# Patient Record
Sex: Female | Born: 1970 | Race: Black or African American | Hispanic: No | Marital: Married | Smoking: Never smoker
Health system: Southern US, Community
[De-identification: ages and names within clinical notes are randomized; demographics above are authoritative.]

## PROBLEM LIST (undated history)

## (undated) DIAGNOSIS — Z789 Other specified health status: Secondary | ICD-10-CM

## (undated) DIAGNOSIS — G43909 Migraine, unspecified, not intractable, without status migrainosus: Secondary | ICD-10-CM

## (undated) DIAGNOSIS — Z973 Presence of spectacles and contact lenses: Secondary | ICD-10-CM

## (undated) DIAGNOSIS — R7303 Prediabetes: Secondary | ICD-10-CM

## (undated) DIAGNOSIS — B9689 Other specified bacterial agents as the cause of diseases classified elsewhere: Secondary | ICD-10-CM

## (undated) DIAGNOSIS — A609 Anogenital herpesviral infection, unspecified: Secondary | ICD-10-CM

## (undated) DIAGNOSIS — N760 Acute vaginitis: Secondary | ICD-10-CM

## (undated) HISTORY — DX: Migraine, unspecified, not intractable, without status migrainosus: G43.909

## (undated) HISTORY — DX: Anogenital herpesviral infection, unspecified: A60.9

## (undated) HISTORY — DX: Other specified bacterial agents as the cause of diseases classified elsewhere: B96.89

## (undated) HISTORY — DX: Other specified bacterial agents as the cause of diseases classified elsewhere: N76.0

---

## 1998-08-19 ENCOUNTER — Other Ambulatory Visit: Admission: RE | Admit: 1998-08-19 | Discharge: 1998-08-19 | Payer: Self-pay | Admitting: Obstetrics

## 1998-10-21 ENCOUNTER — Other Ambulatory Visit: Admission: RE | Admit: 1998-10-21 | Discharge: 1998-10-21 | Payer: Self-pay | Admitting: Obstetrics

## 1999-02-19 ENCOUNTER — Emergency Department (HOSPITAL_COMMUNITY): Admission: EM | Admit: 1999-02-19 | Discharge: 1999-02-19 | Payer: Self-pay | Admitting: Emergency Medicine

## 1999-05-17 ENCOUNTER — Inpatient Hospital Stay (HOSPITAL_COMMUNITY): Admission: AD | Admit: 1999-05-17 | Discharge: 1999-05-17 | Payer: Self-pay | Admitting: Obstetrics

## 1999-05-25 ENCOUNTER — Encounter: Payer: Self-pay | Admitting: Obstetrics

## 1999-05-25 ENCOUNTER — Ambulatory Visit (HOSPITAL_COMMUNITY): Admission: AD | Admit: 1999-05-25 | Discharge: 1999-05-25 | Payer: Self-pay | Admitting: Obstetrics

## 2000-04-21 ENCOUNTER — Other Ambulatory Visit: Admission: RE | Admit: 2000-04-21 | Discharge: 2000-04-21 | Payer: Self-pay | Admitting: *Deleted

## 2000-06-19 ENCOUNTER — Inpatient Hospital Stay (HOSPITAL_COMMUNITY): Admission: AD | Admit: 2000-06-19 | Discharge: 2000-06-19 | Payer: Self-pay | Admitting: Gynecology

## 2000-12-26 ENCOUNTER — Ambulatory Visit (HOSPITAL_COMMUNITY): Admission: RE | Admit: 2000-12-26 | Discharge: 2000-12-26 | Payer: Self-pay | Admitting: *Deleted

## 2000-12-26 ENCOUNTER — Encounter (INDEPENDENT_AMBULATORY_CARE_PROVIDER_SITE_OTHER): Payer: Self-pay

## 2000-12-26 ENCOUNTER — Inpatient Hospital Stay (HOSPITAL_COMMUNITY): Admission: AD | Admit: 2000-12-26 | Discharge: 2000-12-29 | Payer: Self-pay | Admitting: Gynecology

## 2000-12-26 ENCOUNTER — Encounter: Payer: Self-pay | Admitting: *Deleted

## 2001-02-02 ENCOUNTER — Other Ambulatory Visit: Admission: RE | Admit: 2001-02-02 | Discharge: 2001-02-02 | Payer: Self-pay | Admitting: *Deleted

## 2002-10-03 ENCOUNTER — Other Ambulatory Visit: Admission: RE | Admit: 2002-10-03 | Discharge: 2002-10-03 | Payer: Self-pay | Admitting: Gynecology

## 2003-04-01 ENCOUNTER — Emergency Department (HOSPITAL_COMMUNITY): Admission: EM | Admit: 2003-04-01 | Discharge: 2003-04-02 | Payer: Self-pay | Admitting: *Deleted

## 2003-04-07 ENCOUNTER — Encounter: Admission: RE | Admit: 2003-04-07 | Discharge: 2003-04-07 | Payer: Self-pay | Admitting: Chiropractic Medicine

## 2003-04-07 ENCOUNTER — Encounter: Payer: Self-pay | Admitting: Chiropractic Medicine

## 2005-10-17 ENCOUNTER — Other Ambulatory Visit: Admission: RE | Admit: 2005-10-17 | Discharge: 2005-10-17 | Payer: Self-pay | Admitting: Gynecology

## 2006-08-13 ENCOUNTER — Emergency Department (HOSPITAL_COMMUNITY): Admission: EM | Admit: 2006-08-13 | Discharge: 2006-08-13 | Payer: Self-pay | Admitting: Emergency Medicine

## 2006-11-09 ENCOUNTER — Other Ambulatory Visit: Admission: RE | Admit: 2006-11-09 | Discharge: 2006-11-09 | Payer: Self-pay | Admitting: Gynecology

## 2007-07-03 ENCOUNTER — Inpatient Hospital Stay (HOSPITAL_COMMUNITY): Admission: AD | Admit: 2007-07-03 | Discharge: 2007-07-06 | Payer: Self-pay | Admitting: Obstetrics

## 2007-07-04 ENCOUNTER — Encounter (INDEPENDENT_AMBULATORY_CARE_PROVIDER_SITE_OTHER): Payer: Self-pay | Admitting: Obstetrics

## 2007-07-04 HISTORY — PX: TUBAL LIGATION: SHX77

## 2008-05-02 ENCOUNTER — Emergency Department (HOSPITAL_COMMUNITY): Admission: EM | Admit: 2008-05-02 | Discharge: 2008-05-02 | Payer: Self-pay | Admitting: Emergency Medicine

## 2009-04-09 ENCOUNTER — Other Ambulatory Visit: Admission: RE | Admit: 2009-04-09 | Discharge: 2009-04-09 | Payer: Self-pay | Admitting: Gynecology

## 2009-04-09 ENCOUNTER — Ambulatory Visit: Payer: Self-pay | Admitting: Women's Health

## 2009-04-09 ENCOUNTER — Encounter: Payer: Self-pay | Admitting: Women's Health

## 2009-09-23 ENCOUNTER — Ambulatory Visit: Payer: Self-pay | Admitting: Gynecology

## 2009-09-23 HISTORY — PX: INCISE AND DRAIN ABCESS: PRO64

## 2009-11-03 ENCOUNTER — Ambulatory Visit: Payer: Self-pay | Admitting: Gynecology

## 2009-11-04 ENCOUNTER — Ambulatory Visit: Payer: Self-pay | Admitting: Gynecology

## 2009-11-17 ENCOUNTER — Ambulatory Visit: Payer: Self-pay | Admitting: Gynecology

## 2010-01-14 ENCOUNTER — Ambulatory Visit: Payer: Self-pay | Admitting: Women's Health

## 2010-03-18 ENCOUNTER — Ambulatory Visit: Payer: Self-pay | Admitting: Gynecology

## 2010-05-20 ENCOUNTER — Ambulatory Visit: Payer: Self-pay | Admitting: Women's Health

## 2010-05-20 ENCOUNTER — Other Ambulatory Visit: Admission: RE | Admit: 2010-05-20 | Discharge: 2010-05-20 | Payer: Self-pay | Admitting: Gynecology

## 2010-05-24 ENCOUNTER — Emergency Department (HOSPITAL_COMMUNITY): Admission: EM | Admit: 2010-05-24 | Discharge: 2010-05-25 | Payer: Self-pay | Admitting: Emergency Medicine

## 2010-05-26 ENCOUNTER — Ambulatory Visit: Payer: Self-pay | Admitting: Gynecology

## 2010-07-28 ENCOUNTER — Ambulatory Visit: Admit: 2010-07-28 | Payer: Self-pay | Admitting: Women's Health

## 2010-09-14 LAB — CBC
Hemoglobin: 13.3 g/dL (ref 12.0–15.0)
MCH: 30.2 pg (ref 26.0–34.0)
MCHC: 34.4 g/dL (ref 30.0–36.0)
MCV: 88 fL (ref 78.0–100.0)

## 2010-09-14 LAB — BASIC METABOLIC PANEL
CO2: 22 mEq/L (ref 19–32)
Calcium: 9.5 mg/dL (ref 8.4–10.5)
Chloride: 103 mEq/L (ref 96–112)
Creatinine, Ser: 0.76 mg/dL (ref 0.4–1.2)
Glucose, Bld: 145 mg/dL — ABNORMAL HIGH (ref 70–99)
Sodium: 137 mEq/L (ref 135–145)

## 2010-09-14 LAB — DIFFERENTIAL
Eosinophils Absolute: 0 10*3/uL (ref 0.0–0.7)
Eosinophils Relative: 0 % (ref 0–5)
Lymphs Abs: 2.1 10*3/uL (ref 0.7–4.0)
Monocytes Absolute: 0.9 10*3/uL (ref 0.1–1.0)

## 2010-09-14 LAB — URINALYSIS, ROUTINE W REFLEX MICROSCOPIC
Glucose, UA: NEGATIVE mg/dL
Hgb urine dipstick: NEGATIVE
Ketones, ur: 15 mg/dL — AB
Nitrite: NEGATIVE
Protein, ur: NEGATIVE mg/dL
Specific Gravity, Urine: 1.025 (ref 1.005–1.030)
Urobilinogen, UA: 1 mg/dL (ref 0.0–1.0)
pH: 8.5 — ABNORMAL HIGH (ref 5.0–8.0)

## 2010-09-14 LAB — URINE MICROSCOPIC-ADD ON

## 2010-09-14 LAB — CK: Total CK: 73 U/L (ref 7–177)

## 2010-09-14 LAB — POCT PREGNANCY, URINE: Preg Test, Ur: NEGATIVE

## 2010-11-16 NOTE — Op Note (Signed)
NAMEMIKISHA, ROSELAND NO.:  [id number]   MEDICAL RECORD NO.:  [id number]          PATIENT TYPE:  INP   LOCATION:  9134                          FACILITY:  [facility]   PHYSICIAN:  Kathreen Cosier, M.D.DATE OF BIRTH:  Jan 30, 1971   DATE OF PROCEDURE:  07/04/2007  DATE OF DISCHARGE:                               OPERATIVE REPORT   PREOPERATIVE DIAGNOSIS:  Multiparity.   POSTOPERATIVE DIAGNOSIS:  Multiparity.   PROCEDURE:  Postpartum tubal ligation.   PROCEDURE IN DETAIL:  Using epidural, the patient in the supine  position, abdomen was prepped and draped.  Bladder was emptied with a  Foley catheter.  A midline subumbilical incision 1 inch long was made  and carried down to the fascia.  Fascia grasped with two Kocher's,  cleaned, and then the fascia and peritoneum opened with the Mayo  scissors.  Left tube grasped in midportion with Babcock clamp.  Tube  traced to the fimbria and 0 plain suture placed in the mesosalpinx below  the portion of tube within clamp.  This was tied and approximately 1  inch of tube transected.  The procedure done in a similar fashion the  other side.  Lap and sponge counts correct.  Abdomen closed in layers,  peritoneum and fascia continuous suture with 0 Dexon, skin closed with  subcuticular stitch of 4-0 Monocryl.  Blood loss minimal.  The patient  tolerated the procedure well, taken to recovery room in good condition.           ______________________________  Kathreen Cosier, M.D.     BAM/MEDQ  D:  07/04/2007  T:  07/04/2007  Job:  [id number]

## 2010-11-19 NOTE — Op Note (Signed)
[facility]  Patient:    Danielle [name], Danielle [name]                     MRN: [id number] Proc. Date: 12/26/00 Adm. Date:  [id number] Attending:  Merrily Pew                           Operative Report  PREOPERATIVE DIAGNOSES:       1. Term pregnancy, breech presentation.                               2. Oligohydramnios.                               3. Spontaneous rupture of membranes.  POSTOPERATIVE DIAGNOSES:      1. Term pregnancy, breech presentation.                               2. Oligohydramnios.                               3. Spontaneous rupture of membranes.  OPERATION:                    Primary low transverse cervical cesarean                               section.  SURGEON:                      Timothy P. Fontaine, M.D.  ASSISTANT:                    Scrub technician.  ANESTHESIA:                   Spinal.  ESTIMATED BLOOD LOSS:         Less than 500 cc.  COMPLICATIONS:                None.  FINDINGS:                     At 2113 normal female infant.  Apgars 8 and 9. Weight 8 pounds and 10 ounces.  Pelvic anatomy noted to be normal.  DESCRIPTION OF PROCEDURE:     The patient was taken to the operating room and underwent spinal anesthesia, was placed in the left _______ supine position, and received an abdominal preparation with Betadine scrub and Betadine solution.  Bladder emptied with an indwelling Foley catheter placed under sterile technique, and the patient was draped in the usual fashion.  After assuring adequate anesthesia, the abdomen was sharply entered through a Pfannenstiel incision achieving adequate hemostasis at all levels.  The bladder flap was then sharply and bluntly developed without difficulty and the uterus was sharply entered in the lower uterine segment and bluntly extended laterally.  The bulging membranes were ruptured and the fluid noted to be clear.  The infant was found in the frank breech presentation  and underwent a conversion to a footling breech and a classical footling breech extraction. There was an umbilical cord wrapped around the infants thigh.  The nares and  mouth were suctioned upon delivery, the cord doubly clamped and cut, and the infant was handed to pediatrics in attendance.  Samples of cord blood were then obtained.  The placenta was then initially spontaneously extracted. During the extraction process, the umbilical cord avulsed, and subsequently a manual extraction was performed.  The uterus was then exteriorized and the endometrial cavity explored with the sponge to remove all placental and membrane fragments.  The patient received 1 g of Cefotan IV prophylaxis after the infants delivery.  The uterine incision was then closed in one layer using 0 Vicryl suture in a running interlocking stitch.  The uterus was returned to the abdomen which was copiously irrigated.  Adequate hemostasis was visualized and subsequently the anterior fascia was reapproximated using 0 Vicryl suture in a running stitch. Subcutaneous tissues were irrigated, adequate hemostasis achieved with electrocautery, and the skin was reapproximated with staples.  A sterile dressing was applied and the patient was taken to the recovery room in good condition having tolerated the procedure well. DD:  12/26/00 TD:  12/27/00 Job: [id number]

## 2010-11-19 NOTE — Discharge Summary (Signed)
[facility]  Patient:    Danielle [name], MADDING                     MRN: [id number] Adm. Date:  [id number] Disc. Date: [id number] Attending:  Merrily Pew Dictator:   Antony Contras, N.P.                           Discharge Summary  DISCHARGE DIAGNOSES:          Intrauterine pregnancy at term, breech presentation, oligohydramnios, spontaneous rupture of membranes.  PROCEDURE:                    Primary low cervical transverse cesarean section with delivery of viable infant.  HISTORY OF PRESENT ILLNESS:   Patient is a 40 year old gravida 6, para 1-0-4-1 with an LMP of March 16, 2000, [facility] of December 21, 2000.  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  PRENATAL LABORATORIES:        Blood type AB+.  Antibody screen negative. Sickle cell negative.  RPR, HBSAG, HIV nonreactive.  Rubella non-immune. MSAFP normal.  GBS negative.  HOSPITAL COURSE:              Patient was diagnosed with a breech presentation on December 24, 2000 and during that ultrasound was found to have an AFI of 6.8 cm.  She presents on December 26, 2000 with spontaneous rupture of membranes.  She was taken to the OR where primary low transverse cesarean section was performed by Dr. Audie Box under spinal anesthesia.  She delivered an Apgar 8/9 female infant weighing 8 pounds 10 ounces.  Pelvic anatomy was normal. Postoperative course:  She remained afebrile.  She had no difficulty voiding and was able to be discharged on her third postoperative day in satisfactory condition.  LABORATORIES:                 CBC:  Hematocrit 30.1, hemoglobin 10.4, WBC 11.9, platelets 171,000.  DISPOSITION:                  Follow-up in six weeks.  Continue prenatal vitamins and iron, Motrin and Tylox for pain.  Rubella vaccine prior to discharge. DD:  01/19/01 TD:  01/20/01 Job: [id number]

## 2010-11-19 NOTE — H&P (Signed)
[facility]  Patient:    Danielle [name], Danielle [name]                     MRN: [id number] Attending:  Katy Fitch, M.D.                         History and Physical  CHIEF COMPLAINT:              Breech presentation at term.  HISTORY OF PRESENT ILLNESS:   The patient is a 40 year old G 5, P 1, AB 3, at 40 weeks and 5 days, who presented to the office on December 24, 2000, and was noted to feel in breech presentation.  The patient was sent to the hospital for an ultrasound, which showed the patient to be in a complete breech presentation with 6.8 cm of fluid.  The patient returned for an appointment on June 25th, and the fetus was still in the breech presentation, and options for delivery were discussed with the patient, including attempting a vaginal breech, attempting version, and proceeding with a primary low transverse cesarean section.  After discussing the risks and benefits of each procedure, the patient decided to opt for a primary low transverse cesarean section.  The patient has had no other prenatal complications.  PAST MEDICAL HISTORY:         Negative.  PAST SURGICAL HISTORY:        A D&C x 4.  PAST GYN HISTORY:             Cryo surgery for an abnormal Pap smear.  PAST OB HISTORY:              Three elective abortions, one vaginal delivery weighing 5 pounds 6 ounces in 1994.  CURRENT MEDICATIONS:          Prenatal vitamins.  ALLERGIES:                    No known drug allergies.  SOCIAL HISTORY:               Without any alcohol, tobacco, or drugs.  FAMILY HISTORY:               Without mental retardation or epithelial cancers.  PRENATAL LABORATORY DATA:     AB-positive, rubella nonimmune, GBS negative.  PHYSICAL EXAMINATION:  VITAL SIGNS:                  Blood pressure 122/82.  HEENT:                        Clear.  LUNGS:                        Clear to auscultation bilaterally.  HEART:                        A regular rate and  rhythm.  ABDOMEN:                      Gravid, nontender.  Breech presentation.  EXTREMITIES:                  Without cyanosis, clubbing, edema, or tenderness.  PLAN:                         The patient  is scheduled for a primary low transverse cesarean section on December 27, 2000, at 9 a.m.  The risks and benefits of the procedure, including bleeding, transfusion, infection, scar tissue, wound breakdown, damage to internal organs, including the bowel, bladder, ureters, and reproductive organs, fistula formation, hysterectomy, and anesthetic complications of blood clots, stroke, pulmonary embolus, and possibly death.  The patient states that she understands this and desires to proceed.  The patient will have a CBC prior to surgery, and be n.p.o. after midnight. DD:  12/26/00 TD:  12/26/00 Job: [id number]

## 2010-11-19 NOTE — H&P (Signed)
[facility]  Patient:    Danielle [name], Danielle [name]                     MRN: [id number] Adm. Date:  [id number] Attending:  Merrily Pew                         History and Physical  CHIEF COMPLAINT:              Pregnancy at term with probable rupture of membranes.  Breech presentation and oligohydramnios.  HISTORY OF PRESENT ILLNESS:   The patient is a 40 year old G6, P89 female at term gestation.  She recently underwent ultrasound today due to size greater than dates for estimated fetal weight and was found to be in a breech presentation with an AFI of 6.  The patient had been counseled during the day for options and is scheduled for a cesarean section tomorrow morning.  The patient relates this evening a gush of wetness per vagina.  She has continued to leak since then.  She also had some mild cramping.  Her prenatal course has been uncomplicated to date.  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  PAST MEDICAL HISTORY:         Unremarkable.  PAST SURGICAL HISTORY:        D&C x 4.  ALLERGIES:                    None.  MEDICATIONS:                  Vitamins.  REVIEW OF SYSTEMS:            Noncontributory.  PHYSICAL EXAMINATION:  VITAL SIGNS:                  Afebrile.  Blood pressure 140/86 with stable vital signs.  HEENT:                        Normal.  LUNGS:                        Clear.  CARDIAC:                      Regular rate.  No rubs, murmurs or gallops.  ABDOMEN:                      Gravid uterus.  Reactive fetus.  Mild uterine irritability.  PELVIC:                       The cervix is finger tip dilated.  Breech presentation with vaginal moisture noted.  ASSESSMENT:                   As 40 year old G6, P5, AB4 female with term gestation, breech presentation, and probable rupture of membranes for planned cesarean section.  I reviewed the situation with the  patient and will proceed with cesarean section this evening.  I reviewed with her what was involved with cesarean section to include the risks of infection, prolonged antibiotics, wound complications, opening and draining of incisions closure by secondary intention, the risks of hemorrhage necessitating transfusion and the risk of transfusion were reviewed.  The risks of inadvertent injury to internal organs including  bowel, bladder, ureters, vessels and nerves necessitating major exploratory repair with surgeries in the future and repairative surgeries including ostomy formation.  The risk of fetal injury including musculoskeletal, neural and scalp injuries were all reviewed and were accepted.  The patients questions were answered to her satisfactory. She is ready to proceed with surgery. DD:  12/26/00 TD:  12/27/00 Job: [id number]

## 2011-03-23 LAB — CBC
HCT: 25 — ABNORMAL LOW
MCHC: 34.3
MCV: 91.2
Platelets: 169
RDW: 14.3
WBC: 15.6 — ABNORMAL HIGH

## 2011-04-08 LAB — COMPREHENSIVE METABOLIC PANEL
ALT: 11
AST: 22
Alkaline Phosphatase: 114
CO2: 24
Calcium: 8.3 — ABNORMAL LOW
GFR calc Af Amer: >60
GFR calc non Af Amer: >60
Glucose, Bld: 111 — ABNORMAL HIGH
Potassium: 3.7
Sodium: 136

## 2011-04-08 LAB — CBC
Hemoglobin: 10 — ABNORMAL LOW
Platelets: 189
RBC: 3.25 — ABNORMAL LOW
RDW: 14
WBC: 13.4 — ABNORMAL HIGH
WBC: 26.9 — ABNORMAL HIGH

## 2011-04-08 LAB — RPR: RPR Ser Ql: NONREACTIVE

## 2011-04-08 LAB — LACTATE DEHYDROGENASE: LDH: 153

## 2011-07-07 ENCOUNTER — Ambulatory Visit (INDEPENDENT_AMBULATORY_CARE_PROVIDER_SITE_OTHER): Payer: BC Managed Care – PPO | Admitting: Women's Health

## 2011-07-07 ENCOUNTER — Other Ambulatory Visit: Payer: Self-pay | Admitting: Women's Health

## 2011-07-07 ENCOUNTER — Encounter: Payer: Self-pay | Admitting: Women's Health

## 2011-07-07 VITALS — BP 122/80 | Ht 65.25 in | Wt 163.0 lb

## 2011-07-07 DIAGNOSIS — N898 Other specified noninflammatory disorders of vagina: Secondary | ICD-10-CM

## 2011-07-07 DIAGNOSIS — Z13220 Encounter for screening for lipoid disorders: Secondary | ICD-10-CM

## 2011-07-07 DIAGNOSIS — Z833 Family history of diabetes mellitus: Secondary | ICD-10-CM

## 2011-07-07 DIAGNOSIS — B009 Herpesviral infection, unspecified: Secondary | ICD-10-CM

## 2011-07-07 DIAGNOSIS — R350 Frequency of micturition: Secondary | ICD-10-CM

## 2011-07-07 DIAGNOSIS — Z01419 Encounter for gynecological examination (general) (routine) without abnormal findings: Secondary | ICD-10-CM

## 2011-07-07 LAB — LIPID PANEL
Cholesterol: 215 mg/dL — ABNORMAL HIGH (ref 0–200)
Triglycerides: 182 mg/dL — ABNORMAL HIGH (ref <150)
VLDL: 36 mg/dL (ref 0–40)

## 2011-07-07 LAB — CBC WITH DIFFERENTIAL/PLATELET
Basophils Relative: 0 % (ref 0–1)
Eosinophils Absolute: 0.2 10*3/uL (ref 0.0–0.7)
Eosinophils Relative: 2 % (ref 0–5)
Hemoglobin: 12.3 g/dL (ref 12.0–15.0)
Lymphs Abs: 2.9 10*3/uL (ref 0.7–4.0)
MCH: 28.5 pg (ref 26.0–34.0)
MCHC: 31.5 g/dL (ref 30.0–36.0)
MCV: 90.5 fL (ref 78.0–100.0)
Monocytes Absolute: 0.6 10*3/uL (ref 0.1–1.0)
Monocytes Relative: 6 % (ref 3–12)
RBC: 4.32 MIL/uL (ref 3.87–5.11)

## 2011-07-07 LAB — WET PREP, GENITAL: Clue Cells Wet Prep HPF POC: NONE SEEN

## 2011-07-07 LAB — URINALYSIS, ROUTINE W REFLEX MICROSCOPIC
Bilirubin Urine: NEGATIVE
Glucose, UA: NEGATIVE mg/dL
Ketones, ur: NEGATIVE mg/dL
Protein, ur: NEGATIVE mg/dL
Specific Gravity, Urine: 1.025 (ref 1.005–1.030)
Urobilinogen, UA: 0.2 mg/dL (ref 0.0–1.0)

## 2011-07-07 LAB — URINALYSIS, MICROSCOPIC ONLY
Casts: NONE SEEN
Crystals: NONE SEEN

## 2011-07-07 MED ORDER — VALACYCLOVIR HCL 500 MG PO TABS: 500.0000 mg | ORAL_TABLET | Freq: Two times a day (BID) | ORAL | Status: AC

## 2011-07-07 NOTE — Progress Notes (Signed)
Danielle [name] 1970/09/18 [id number]    History:    The patient presents for annual exam.  Monthly 4-5 day cycle/BTL/same partner. Complaint of increased urinary frequency, urgency and abdominal pressure when bladder is full. States has occasional pain that radiates from her abdomen through to her buttocks. Denies any constipation or changes in elimination. History of normal Paps.   Past medical history, past surgical history, family history and social history were all reviewed and documented in the EPIC chart. Laid off from [employer], 3 children ages 21, 80, and 4 all doing well an 39 year old has had Gardasil.   ROS:  A  ROS was performed and pertinent positives and negatives are included in the history.  Exam:  Filed Vitals:   07/07/11 1434  BP: 122/80    General appearance:  Normal Head/Neck:  Normal, without cervical or supraclavicular adenopathy. Thyroid:  Symmetrical, normal in size, without palpable masses or nodularity. Respiratory  Effort:  Normal  Auscultation:  Clear without wheezing or rhonchi Cardiovascular  Auscultation:  Regular rate, without rubs, murmurs or gallops  Edema/varicosities:  Not grossly evident Abdominal  Soft,nontender, without masses, guarding or rebound.  Liver/spleen:  No organomegaly noted  Hernia:  None appreciated  Skin  Inspection:  Grossly normal  Palpation:  Grossly normal Neurologic/psychiatric  Orientation:  Normal with appropriate conversation.  Mood/affect:  Normal  Genitourinary    Breasts: Examined lying and sitting.     Right: Without masses, retractions, discharge or axillary adenopathy.     Left: Without masses, retractions, discharge or axillary adenopathy.   Inguinal/mons:  Normal without inguinal adenopathy  External genitalia:  Normal  BUS/Urethra/Skene's glands:  Normal  Bladder:  Slight tenderness and suprapubic area   Vagina:  Normal/wet prep negative  Cervix:  Normal  Uterus:   normal in size, shape  and contour.  Midline and mobile  Adnexa/parametria:     Rt: Without masses or tenderness.   Lt: Without masses or tenderness.  Anus and perineum: Normal  Digital rectal exam: Normal sphincter tone without palpated masses or tenderness  Assessment/Plan:  41 y.o. SBF G6 P3  for annual exam.    UTI with abdominal discomfort Regular monthly cycle/BTL Rare HSV  Plan: Urine culture pending, Macrobid one by mouth twice a day for 7 days #14 instructed to take with food. If pain does not resolve after antibiotic will proceed with  ultrasound. SBEs, increasing exercise, decreasing calories calcium rich diet, decreasing abdominal weight encouraged. Valtrex 500 twice a day for 3-5 days when necessary, prescription, proper use given and reviewed. CBC, glucose, lipid profile, UA and culture.     Harrington Challenger [facility], 3:33 PM 07/07/2011

## 2011-07-08 ENCOUNTER — Telehealth: Payer: Self-pay | Admitting: Women's Health

## 2011-07-08 DIAGNOSIS — N390 Urinary tract infection, site not specified: Secondary | ICD-10-CM

## 2011-07-08 MED ORDER — NITROFURANTOIN MONOHYD MACRO 100 MG PO CAPS: 100.0000 mg | ORAL_CAPSULE | Freq: Two times a day (BID) | ORAL | Status: AC

## 2011-07-08 NOTE — Telephone Encounter (Signed)
Telephone call reviewed lipid profile and other labs were normal. Cholesterol is elevated at 215, HDL low at 38 triglycerides elevated 182 reviewed importance of exercise, increasing fiber rich foods, but especially needs to increase exercise to 30 minutesof the week. Instructed  to add a fish oil supplement.

## 2011-07-09 LAB — URINE CULTURE: Colony Count: 10000

## 2012-09-15 ENCOUNTER — Emergency Department (HOSPITAL_COMMUNITY)
Admission: EM | Admit: 2012-09-15 | Discharge: 2012-09-15 | Disposition: A | Discharge status: Home / Self Care | Payer: Managed Care, Other (non HMO) | Attending: Emergency Medicine | Admitting: Emergency Medicine

## 2012-09-15 ENCOUNTER — Encounter (HOSPITAL_COMMUNITY): Payer: Self-pay

## 2012-09-15 DIAGNOSIS — Z8742 Personal history of other diseases of the female genital tract: Secondary | ICD-10-CM | POA: Insufficient documentation

## 2012-09-15 DIAGNOSIS — Y92410 Unspecified street and highway as the place of occurrence of the external cause: Secondary | ICD-10-CM | POA: Insufficient documentation

## 2012-09-15 DIAGNOSIS — Y9389 Activity, other specified: Secondary | ICD-10-CM | POA: Insufficient documentation

## 2012-09-15 DIAGNOSIS — Z8619 Personal history of other infectious and parasitic diseases: Secondary | ICD-10-CM | POA: Insufficient documentation

## 2012-09-15 DIAGNOSIS — IMO0002 Reserved for concepts with insufficient information to code with codable children: Secondary | ICD-10-CM | POA: Insufficient documentation

## 2012-09-15 MED ORDER — NAPROXEN 500 MG PO TABS: 500.0000 mg | ORAL_TABLET | Freq: Two times a day (BID) | ORAL | Status: DC

## 2012-09-15 MED ORDER — METHOCARBAMOL 500 MG PO TABS
500.0000 mg | ORAL_TABLET | Freq: Two times a day (BID) | ORAL | Status: DC | PRN
Start: 2012-09-15 — End: 2013-01-28

## 2012-09-15 MED ORDER — OXYCODONE-ACETAMINOPHEN 5-325 MG PO TABS
1.0000 | ORAL_TABLET | Freq: Once | ORAL | Status: AC
  Administered 2012-09-15: 1 via ORAL
  Filled 2012-09-15: qty 1

## 2012-09-15 MED ORDER — NAPROXEN 250 MG PO TABS
500.0000 mg | ORAL_TABLET | Freq: Once | ORAL | Status: AC
  Administered 2012-09-15: 500 mg via ORAL
  Filled 2012-09-15: qty 2
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
## 2013-01-28 ENCOUNTER — Other Ambulatory Visit (HOSPITAL_COMMUNITY)
Admission: RE | Admit: 2013-01-28 | Discharge: 2013-01-28 | Disposition: A | Discharge status: Home / Self Care | Payer: Managed Care, Other (non HMO) | Source: Ambulatory Visit | Attending: Gynecology | Admitting: Gynecology

## 2013-01-28 ENCOUNTER — Ambulatory Visit (INDEPENDENT_AMBULATORY_CARE_PROVIDER_SITE_OTHER): Payer: Managed Care, Other (non HMO) | Admitting: Women's Health

## 2013-01-28 ENCOUNTER — Encounter: Payer: Self-pay | Admitting: Women's Health

## 2013-01-28 VITALS — BP 112/76 | Ht 65.0 in | Wt 170.0 lb

## 2013-01-28 DIAGNOSIS — Z833 Family history of diabetes mellitus: Secondary | ICD-10-CM

## 2013-01-28 DIAGNOSIS — Z01419 Encounter for gynecological examination (general) (routine) without abnormal findings: Secondary | ICD-10-CM | POA: Insufficient documentation

## 2013-01-28 DIAGNOSIS — A609 Anogenital herpesviral infection, unspecified: Secondary | ICD-10-CM | POA: Insufficient documentation

## 2013-01-28 DIAGNOSIS — B009 Herpesviral infection, unspecified: Secondary | ICD-10-CM

## 2013-01-28 DIAGNOSIS — Z13220 Encounter for screening for lipoid disorders: Secondary | ICD-10-CM

## 2013-01-28 LAB — CBC WITH DIFFERENTIAL/PLATELET
Basophils Absolute: 0 10*3/uL (ref 0.0–0.1)
Eosinophils Absolute: 0.2 10*3/uL (ref 0.0–0.7)
Eosinophils Relative: 2 % (ref 0–5)
HCT: 38.1 % (ref 36.0–46.0)
Lymphocytes Relative: 33 % (ref 12–46)
MCH: 29.1 pg (ref 26.0–34.0)
MCV: 85.8 fL (ref 78.0–100.0)
Monocytes Absolute: 0.5 10*3/uL (ref 0.1–1.0)
RDW: 13.6 % (ref 11.5–15.5)
WBC: 8.8 10*3/uL (ref 4.0–10.5)

## 2013-01-28 LAB — LIPID PANEL
HDL: 37 mg/dL — ABNORMAL LOW (ref >39)
LDL Cholesterol: 109 mg/dL — ABNORMAL HIGH (ref 0–99)
Triglycerides: 148 mg/dL (ref <150)
VLDL: 30 mg/dL (ref 0–40)

## 2013-01-28 MED ORDER — VALACYCLOVIR HCL 500 MG PO TABS: 500.0000 mg | ORAL_TABLET | Freq: Every day | ORAL | Status: DC

## 2013-01-28 NOTE — Patient Instructions (Signed)

## 2013-01-28 NOTE — Addendum Note (Signed)
Addended by: Richardson Chiquito on: 01/28/2013 12:46 PM   Modules accepted: Orders

## 2013-01-28 NOTE — Progress Notes (Signed)
Danielle [name] 21-Jul-1970 [id number]    History:    The patient presents for annual exam.  Monthly cycles/BTL. Normal Pap history. History of a Skene's abscess 2011.  HSV with rare outbreaks until  this past year.   Past medical history, past surgical history, family history and social history were all reviewed and documented in the EPIC chart. Works at [employer]. Danielle [name] 19, Danielle [name] 12, Danielle [name] 5 all doing well. Got married this past year, same partner for 14 years.  Parents diabetes, mother hypertension.   ROS:  A  ROS was performed and pertinent positives and negatives are included in the history.  Exam:  Filed Vitals:   01/28/13 1152  BP: 112/76    General appearance:  Normal Head/Neck:  Normal, without cervical or supraclavicular adenopathy. Thyroid:  Symmetrical, normal in size, without palpable masses or nodularity. Respiratory  Effort:  Normal  Auscultation:  Clear without wheezing or rhonchi Cardiovascular  Auscultation:  Regular rate, without rubs, murmurs or gallops  Edema/varicosities:  Not grossly evident Abdominal  Soft,nontender, without masses, guarding or rebound.  Liver/spleen:  No organomegaly noted  Hernia:  None appreciated  Skin  Inspection:  Grossly normal  Palpation:  Grossly normal Neurologic/psychiatric  Orientation:  Normal with appropriate conversation.  Mood/affect:  Normal  Genitourinary    Breasts: Examined lying and sitting.     Right: Without masses, retractions, discharge or axillary adenopathy.     Left: Without masses, retractions, discharge or axillary adenopathy.   Inguinal/mons:  Normal without inguinal adenopathy  External genitalia:  Normal  BUS/Urethra/Skene's glands:  Normal  Bladder:  Normal  Vagina:  Normal  Cervix:  Normal  Uterus:   normal in size, shape and contour.  Midline and mobile  Adnexa/parametria:     Rt: Without masses or tenderness.   Lt: Without masses or tenderness.  Anus and perineum: Normal  Digital  rectal exam: Normal sphincter tone without palpated masses or tenderness  Assessment/Plan:  42 y.o. MBF G4P3 for annual exam with complaint of recurrent HSV.  HSV Monthly cycles/BTL  Plan: Valtrex 500 daily, instructed to call if no relief of recurrent outbreaks. SBE's, schedule annual screening at 42, increase regular exercise, decrease calories for weight loss and health. Calcium rich diet, vitamin D 1000 daily encouraged. CBC, glucose, lipid panel, UA, Pap. Pap normal 2011, new screening guidelines reviewed.    Harrington Challenger [facility], 12:22 PM 01/28/2013

## 2013-01-29 LAB — URINALYSIS W MICROSCOPIC + REFLEX CULTURE
Bacteria, UA: NONE SEEN
Hgb urine dipstick: NEGATIVE
Ketones, ur: NEGATIVE mg/dL
Nitrite: NEGATIVE
Specific Gravity, Urine: 1.025 (ref 1.005–1.030)
Urobilinogen, UA: 0.2 mg/dL (ref 0.0–1.0)

## 2014-03-26 ENCOUNTER — Telehealth: Payer: Self-pay | Admitting: *Deleted

## 2014-03-26 DIAGNOSIS — A609 Anogenital herpesviral infection, unspecified: Secondary | ICD-10-CM

## 2014-03-26 MED ORDER — VALACYCLOVIR HCL 500 MG PO TABS: 500.0000 mg | ORAL_TABLET | Freq: Every day | ORAL | Status: DC

## 2014-03-26 NOTE — Telephone Encounter (Signed)
Pt called requesting refill on Valtrex 500 mg has annual schedule on 04/30/14. Left on voicemail this was sent

## 2014-04-30 ENCOUNTER — Encounter: Payer: Self-pay | Admitting: Women's Health

## 2014-04-30 ENCOUNTER — Ambulatory Visit (INDEPENDENT_AMBULATORY_CARE_PROVIDER_SITE_OTHER): Payer: Managed Care, Other (non HMO) | Admitting: Women's Health

## 2014-04-30 VITALS — BP 118/80 | Ht 65.0 in | Wt 178.0 lb

## 2014-04-30 DIAGNOSIS — A499 Bacterial infection, unspecified: Secondary | ICD-10-CM

## 2014-04-30 DIAGNOSIS — N760 Acute vaginitis: Secondary | ICD-10-CM

## 2014-04-30 DIAGNOSIS — Z01419 Encounter for gynecological examination (general) (routine) without abnormal findings: Secondary | ICD-10-CM

## 2014-04-30 DIAGNOSIS — B9689 Other specified bacterial agents as the cause of diseases classified elsewhere: Secondary | ICD-10-CM

## 2014-04-30 DIAGNOSIS — A609 Anogenital herpesviral infection, unspecified: Secondary | ICD-10-CM

## 2014-04-30 DIAGNOSIS — Z13220 Encounter for screening for lipoid disorders: Secondary | ICD-10-CM

## 2014-04-30 DIAGNOSIS — Z23 Encounter for immunization: Secondary | ICD-10-CM

## 2014-04-30 LAB — CBC WITH DIFFERENTIAL/PLATELET
Basophils Absolute: 0 10*3/uL (ref 0.0–0.1)
Basophils Relative: 0 % (ref 0–1)
EOS ABS: 0.2 10*3/uL (ref 0.0–0.7)
Eosinophils Relative: 2 % (ref 0–5)
HCT: 38.3 % (ref 36.0–46.0)
HEMOGLOBIN: 12.7 g/dL (ref 12.0–15.0)
LYMPHS ABS: 3.8 10*3/uL (ref 0.7–4.0)
Lymphocytes Relative: 37 % (ref 12–46)
MCH: 28.7 pg (ref 26.0–34.0)
MCHC: 33.2 g/dL (ref 30.0–36.0)
MCV: 86.7 fL (ref 78.0–100.0)
MONOS PCT: 6 % (ref 3–12)
Monocytes Absolute: 0.6 10*3/uL (ref 0.1–1.0)
NEUTROS ABS: 5.7 10*3/uL (ref 1.7–7.7)
NEUTROS PCT: 55 % (ref 43–77)
PLATELETS: 393 10*3/uL (ref 150–400)
RBC: 4.42 MIL/uL (ref 3.87–5.11)
RDW: 13.5 % (ref 11.5–15.5)
WBC: 10.4 10*3/uL (ref 4.0–10.5)

## 2014-04-30 LAB — LIPID PANEL
CHOL/HDL RATIO: 5.3 ratio
CHOLESTEROL: 175 mg/dL (ref 0–200)
HDL: 33 mg/dL — AB (ref >39)
TRIGLYCERIDES: 463 mg/dL — AB (ref <150)

## 2014-04-30 LAB — COMPREHENSIVE METABOLIC PANEL
ALBUMIN: 4.2 g/dL (ref 3.5–5.2)
ALK PHOS: 67 U/L (ref 39–117)
ALT: 11 U/L (ref 0–35)
AST: 12 U/L (ref 0–37)
BILIRUBIN TOTAL: 0.3 mg/dL (ref 0.2–1.2)
BUN: 9 mg/dL (ref 6–23)
CO2: 27 meq/L (ref 19–32)
Calcium: 9.1 mg/dL (ref 8.4–10.5)
Chloride: 102 mEq/L (ref 96–112)
Creat: 0.75 mg/dL (ref 0.50–1.10)
GLUCOSE: 90 mg/dL (ref 70–99)
POTASSIUM: 4 meq/L (ref 3.5–5.3)
SODIUM: 139 meq/L (ref 135–145)
TOTAL PROTEIN: 7.2 g/dL (ref 6.0–8.3)

## 2014-04-30 LAB — WET PREP FOR TRICH, YEAST, CLUE
Clue Cells Wet Prep HPF POC: NONE SEEN
TRICH WET PREP: NONE SEEN
Yeast Wet Prep HPF POC: NONE SEEN

## 2014-04-30 MED ORDER — METRONIDAZOLE 0.75 % VA GEL: VAGINAL | Status: DC

## 2014-04-30 MED ORDER — VALACYCLOVIR HCL 500 MG PO TABS: 500.0000 mg | ORAL_TABLET | Freq: Every day | ORAL | Status: DC

## 2014-04-30 NOTE — Patient Instructions (Signed)

## 2014-04-30 NOTE — Progress Notes (Signed)
Danielle [name] [birth year] [id number]    History:    Presents for annual exam.  Regular monthly cycle/BTL. HSV with frequent outbreaks. 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 Normal Pap history has not had a mammogram.  Past medical history, past surgical history, family history and social history were all reviewed and documented in the EPIC chart. Works for [employer]. 3 daughters, 221, 5713, 6 all doing well. Parents diabetes and hypertension. Married last year to long-term partner.  ROS:  A  12 point ROS was performed and pertinent positives and negatives are included.  Exam:  Filed Vitals:   04/30/14 1623  BP: 118/80    General appearance:  Normal Thyroid:  Symmetrical, normal in size, without palpable masses or nodularity. Respiratory  Auscultation:  Clear without wheezing or rhonchi Cardiovascular  Auscultation:  Regular rate, without rubs, murmurs or gallops  Edema/varicosities:  Not grossly evident Abdominal  Soft,nontender, without masses, guarding or rebound.  Liver/spleen:  No organomegaly noted  Hernia:  None appreciated  Skin  Inspection:  Grossly normal   Breasts: Examined lying and sitting.     Right: Without masses, retractions, discharge or axillary adenopathy.     Left: Without masses, retractions, discharge or axillary adenopathy. Gentitourinary   Inguinal/mons:  Normal without inguinal adenopathy  External genitalia:  Normal  BUS/Urethra/Skene's glands:  Normal  Vagina:  Normal wet prep negative  Cervix:  Normal  Uterus:  normal in size, shape and contour.  Midline and mobile  Adnexa/parametria:     Rt: Without masses or tenderness.   Lt: Without masses or tenderness.  Anus and perineum: Normal  Digital rectal exam: Normal sphincter tone without palpated masses or tenderness  Assessment/Plan:  43 y.o. MWF G3P3 for annual exam.    HSV Monthly cycle/BTL History of recurrent BV  Plan: Valtrex 500 by  mouth daily prescription, proper use given and reviewed, instructed to call if continued problems. SBE's, reviewed importance of annual screening mammogram, will schedule. Increase regular exercise, decrease calories for weight loss, calcium rich diet, vitamin D 1000 daily encouraged. CBC, glucose, lipid panel, UA, Pap normal 2014, new screening guidelines reviewed. Prescription of MetroGel half applicator monthly after cycle prn for prevention of recurrent BV, instructed to call if problems.    Harrington ChallengerYOUNG,Nazarene [facility] HospitalWHNP, 5:14 PM 04/30/2014

## 2014-05-01 LAB — URINALYSIS W MICROSCOPIC + REFLEX CULTURE
BACTERIA UA: NONE SEEN
BILIRUBIN URINE: NEGATIVE
Casts: NONE SEEN
Crystals: NONE SEEN
GLUCOSE, UA: NEGATIVE mg/dL
HGB URINE DIPSTICK: NEGATIVE
KETONES UR: NEGATIVE mg/dL
Leukocytes, UA: NEGATIVE
Nitrite: NEGATIVE
PROTEIN: NEGATIVE mg/dL
Specific Gravity, Urine: 1.023 (ref 1.005–1.030)
UROBILINOGEN UA: 1 mg/dL (ref 0.0–1.0)
pH: 7.5 (ref 5.0–8.0)

## 2014-05-05 ENCOUNTER — Encounter: Payer: Self-pay | Admitting: Women's Health

## 2014-06-13 ENCOUNTER — Other Ambulatory Visit: Payer: Self-pay | Admitting: Women's Health

## 2014-06-13 DIAGNOSIS — E782 Mixed hyperlipidemia: Secondary | ICD-10-CM

## 2014-10-04 ENCOUNTER — Emergency Department (INDEPENDENT_AMBULATORY_CARE_PROVIDER_SITE_OTHER)
Admission: EM | Admit: 2014-10-04 | Discharge: 2014-10-04 | Disposition: A | Discharge status: Home / Self Care | Payer: Managed Care, Other (non HMO) | Source: Home / Self Care | Attending: Family Medicine | Admitting: Family Medicine

## 2014-10-04 ENCOUNTER — Encounter (HOSPITAL_COMMUNITY): Payer: Self-pay | Admitting: Emergency Medicine

## 2014-10-04 DIAGNOSIS — L509 Urticaria, unspecified: Secondary | ICD-10-CM

## 2014-10-04 MED ORDER — PREDNISONE 10 MG PO KIT: PACK | ORAL | Status: DC

## 2014-10-04 NOTE — ED Provider Notes (Signed)
BREANNA SHORKEY is a 44 y.o. female who presents to Urgent Care today for hives. Patient developed urticaria and swelling today. She notes mild lip swelling as well. No tongue swelling difficulty breathing. No fevers or chills nausea vomiting or diarrhea. Patient recently started taking minocycline. She discontinued this medicine 3 days ago. No chest pains palpitations or trouble breathing. No treatment tried yet for hives.   Past Medical History  Diagnosis Date  . Bacterial vaginosis     RECURRENT  . HSV (herpes simplex virus) infection    Past Surgical History  Procedure Laterality Date  . Tubal ligation    . Incise and drain abcess  032311    LEFT LABIAL-SKENE   . Cesarean section     History  Substance Use Topics  . Smoking status: Never Smoker   . Smokeless tobacco: Never Used  . Alcohol Use: Yes     Comment: occassionally   ROS as above Medications: No current facility-administered medications for this encounter.   Current Outpatient Prescriptions  Medication Sig Dispense Refill  . metroNIDAZOLE (METROGEL VAGINAL) 0.75 % vaginal gel 1 applicator per vagina at HS x 5 70 g 1  . PredniSONE 10 MG KIT 12 day dose pack po 1 kit 0  . valACYclovir (VALTREX) 500 MG tablet Take 1 tablet (500 mg total) by mouth daily. 90 tablet 4   Allergies  Allergen Reactions  . Septra [Bactrim]     Unknown     Exam:  BP 138/72 mmHg  Pulse 80  Temp(Src) 98.1 F (36.7 C) (Oral)  Resp 18  SpO2 100%  LMP 09/03/2014 Gen: Well NAD HEENT: EOMI,  MMM minimal lip swelling. No tongue swelling. Lungs: Normal work of breathing. CTABL Heart: RRR no MRG Abd: NABS, Soft. Nondistended, Nontender Exts: Brisk capillary refill, warm and well perfused.  Skin: Urticaria present on trunk. Mild diffuse right hand swelling. Nontender. Normal motion.  No results found for this or any previous visit (from the past 24 hour(s)). No results found.  Assessment and Plan: 44 y.o. female with urticaria  likely related to recent minocycline exposure. Discontinue minocycline. Treat with prednisone dose pack diphenhydramine and ranitidine.  Discussed warning signs or symptoms. Please see discharge instructions. Patient expresses understanding.     Gregor Hams, MD 10/04/14 2001

## 2014-10-04 NOTE — Discharge Instructions (Signed)
Thank you for coming in today. Take prednisone starting today. Take 50 mg of Benadryl (2 pills) every 6 hours. Take over-the-counter ranitidine twice daily Return as needed. Go to the emergency room if you get worse. Call or go to the emergency room if you get worse, have trouble breathing, have chest pains, or palpitations.   Hives Hives are itchy, red, swollen areas of the skin. They can vary in size and location on your body. Hives can come and go for hours or several days (acute hives) or for several weeks (chronic hives). Hives do not spread from person to person (noncontagious). They may get worse with scratching, exercise, and emotional stress. CAUSES   Allergic reaction to food, additives, or drugs.  Infections, including the common cold.  Illness, such as vasculitis, lupus, or thyroid disease.  Exposure to sunlight, heat, or cold.  Exercise.  Stress.  Contact with chemicals. SYMPTOMS   Red or white swollen patches on the skin. The patches may change size, shape, and location quickly and repeatedly.  Itching.  Swelling of the hands, feet, and face. This may occur if hives develop deeper in the skin. DIAGNOSIS  Your caregiver can usually tell what is wrong by performing a physical exam. Skin or blood tests may also be done to determine the cause of your hives. In some cases, the cause cannot be determined. TREATMENT  Mild cases usually get better with medicines such as antihistamines. Severe cases may require an emergency epinephrine injection. If the cause of your hives is known, treatment includes avoiding that trigger.  HOME CARE INSTRUCTIONS   Avoid causes that trigger your hives.  Take antihistamines as directed by your caregiver to reduce the severity of your hives. Non-sedating or low-sedating antihistamines are usually recommended. Do not drive while taking an antihistamine.  Take any other medicines prescribed for itching as directed by your  caregiver.  Wear loose-fitting clothing.  Keep all follow-up appointments as directed by your caregiver. SEEK MEDICAL CARE IF:   You have persistent or severe itching that is not relieved with medicine.  You have painful or swollen joints. SEEK IMMEDIATE MEDICAL CARE IF:   You have a fever.  Your tongue or lips are swollen.  You have trouble breathing or swallowing.  You feel tightness in the throat or chest.  You have abdominal pain. These problems may be the first sign of a life-threatening allergic reaction. Call your local emergency services (911 in [place] MAKE SURE YOU:   Understand these instructions.  Will watch your condition.  Will get help right away if you are not doing well or get worse. Document Released: 06/20/2005 Document Revised: 06/25/2013 Document Reviewed: 09/13/2011 [facility] Patient Information 2015 [place], [place]. This information is not intended to replace advice given to you by your health care provider. Make sure you discuss any questions you have with your health care provider.

## 2014-10-04 NOTE — ED Notes (Signed)
C/o hives all over body Sx include swelling of bilateral hands and lips Denies difficult breathing/swallowing Reports new med for Acne/Synodyne 80mg   Alert, no signs of acute distress.

## 2015-02-11 ENCOUNTER — Other Ambulatory Visit: Payer: Self-pay

## 2015-02-11 DIAGNOSIS — Z1231 Encounter for screening mammogram for malignant neoplasm of breast: Secondary | ICD-10-CM

## 2015-02-24 ENCOUNTER — Encounter (HOSPITAL_COMMUNITY): Payer: Self-pay | Admitting: *Deleted

## 2015-02-24 ENCOUNTER — Emergency Department (HOSPITAL_COMMUNITY)
Admission: EM | Admit: 2015-02-24 | Discharge: 2015-02-24 | Disposition: A | Discharge status: Home / Self Care | Payer: No Typology Code available for payment source | Attending: Emergency Medicine | Admitting: Emergency Medicine

## 2015-02-24 DIAGNOSIS — S4992XA Unspecified injury of left shoulder and upper arm, initial encounter: Secondary | ICD-10-CM | POA: Insufficient documentation

## 2015-02-24 DIAGNOSIS — S161XXA Strain of muscle, fascia and tendon at neck level, initial encounter: Secondary | ICD-10-CM | POA: Insufficient documentation

## 2015-02-24 DIAGNOSIS — Y9389 Activity, other specified: Secondary | ICD-10-CM | POA: Diagnosis not present

## 2015-02-24 DIAGNOSIS — Y998 Other external cause status: Secondary | ICD-10-CM | POA: Diagnosis not present

## 2015-02-24 DIAGNOSIS — Y92410 Unspecified street and highway as the place of occurrence of the external cause: Secondary | ICD-10-CM | POA: Insufficient documentation

## 2015-02-24 DIAGNOSIS — Z8619 Personal history of other infectious and parasitic diseases: Secondary | ICD-10-CM | POA: Diagnosis not present

## 2015-02-24 DIAGNOSIS — S0990XA Unspecified injury of head, initial encounter: Secondary | ICD-10-CM | POA: Insufficient documentation

## 2015-02-24 DIAGNOSIS — S199XXA Unspecified injury of neck, initial encounter: Secondary | ICD-10-CM | POA: Diagnosis present

## 2015-02-24 DIAGNOSIS — Z8742 Personal history of other diseases of the female genital tract: Secondary | ICD-10-CM | POA: Insufficient documentation

## 2015-02-24 DIAGNOSIS — Z79899 Other long term (current) drug therapy: Secondary | ICD-10-CM | POA: Diagnosis not present

## 2015-02-24 MED ORDER — CYCLOBENZAPRINE HCL 10 MG PO TABS
5.0000 mg | ORAL_TABLET | Freq: Once | ORAL | Status: AC
  Administered 2015-02-24: 5 mg via ORAL
  Filled 2015-02-24: qty 1

## 2015-02-24 MED ORDER — IBUPROFEN 400 MG PO TABS
600.0000 mg | ORAL_TABLET | Freq: Once | ORAL | Status: AC
  Administered 2015-02-24: 600 mg via ORAL
  Filled 2015-02-24: qty 2

## 2015-02-24 MED ORDER — CYCLOBENZAPRINE HCL 5 MG PO TABS: 5.0000 mg | ORAL_TABLET | Freq: Three times a day (TID) | ORAL | Status: DC

## 2015-02-24 MED ORDER — IBUPROFEN 600 MG PO TABS: 600.0000 mg | ORAL_TABLET | Freq: Three times a day (TID) | ORAL | Status: DC
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
## 2015-02-24 NOTE — ED Notes (Signed)
Patient in Pediatrics with son
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
## 2015-02-26 ENCOUNTER — Ambulatory Visit
Admission: RE | Admit: 2015-02-26 | Discharge: 2015-02-26 | Disposition: A | Discharge status: Home / Self Care | Payer: Managed Care, Other (non HMO) | Source: Ambulatory Visit

## 2015-02-26 DIAGNOSIS — Z1231 Encounter for screening mammogram for malignant neoplasm of breast: Secondary | ICD-10-CM

## 2015-03-02 ENCOUNTER — Other Ambulatory Visit: Payer: Self-pay | Admitting: Women's Health

## 2015-03-02 DIAGNOSIS — R928 Other abnormal and inconclusive findings on diagnostic imaging of breast: Secondary | ICD-10-CM

## 2015-03-05 ENCOUNTER — Ambulatory Visit
Admission: RE | Admit: 2015-03-05 | Discharge: 2015-03-05 | Disposition: A | Discharge status: Home / Self Care | Payer: Managed Care, Other (non HMO) | Source: Ambulatory Visit | Attending: Women's Health | Admitting: Women's Health

## 2015-03-05 DIAGNOSIS — R928 Other abnormal and inconclusive findings on diagnostic imaging of breast: Secondary | ICD-10-CM

## 2015-03-06 ENCOUNTER — Other Ambulatory Visit: Payer: Managed Care, Other (non HMO)

## 2015-03-10 ENCOUNTER — Ambulatory Visit
Admission: RE | Admit: 2015-03-10 | Discharge: 2015-03-10 | Disposition: A | Discharge status: Home / Self Care | Payer: Managed Care, Other (non HMO) | Source: Ambulatory Visit | Attending: Women's Health | Admitting: Women's Health

## 2015-03-10 ENCOUNTER — Other Ambulatory Visit: Payer: Self-pay | Admitting: Women's Health

## 2015-03-10 DIAGNOSIS — R928 Other abnormal and inconclusive findings on diagnostic imaging of breast: Secondary | ICD-10-CM

## 2015-03-16 ENCOUNTER — Other Ambulatory Visit: Payer: Self-pay | Admitting: Women's Health

## 2015-03-16 DIAGNOSIS — R928 Other abnormal and inconclusive findings on diagnostic imaging of breast: Secondary | ICD-10-CM

## 2015-03-17 ENCOUNTER — Ambulatory Visit
Admission: RE | Admit: 2015-03-17 | Discharge: 2015-03-17 | Disposition: A | Discharge status: Home / Self Care | Payer: Managed Care, Other (non HMO) | Source: Ambulatory Visit | Attending: Women's Health | Admitting: Women's Health

## 2015-03-17 DIAGNOSIS — R928 Other abnormal and inconclusive findings on diagnostic imaging of breast: Secondary | ICD-10-CM

## 2015-05-05 ENCOUNTER — Encounter: Payer: Self-pay | Admitting: Women's Health

## 2015-05-05 ENCOUNTER — Ambulatory Visit (INDEPENDENT_AMBULATORY_CARE_PROVIDER_SITE_OTHER): Payer: Managed Care, Other (non HMO) | Admitting: Women's Health

## 2015-05-05 VITALS — BP 122/80 | Ht 65.0 in | Wt 175.0 lb

## 2015-05-05 DIAGNOSIS — B3731 Acute candidiasis of vulva and vagina: Secondary | ICD-10-CM

## 2015-05-05 DIAGNOSIS — A609 Anogenital herpesviral infection, unspecified: Secondary | ICD-10-CM | POA: Diagnosis not present

## 2015-05-05 DIAGNOSIS — Z9851 Tubal ligation status: Secondary | ICD-10-CM

## 2015-05-05 DIAGNOSIS — B373 Candidiasis of vulva and vagina: Secondary | ICD-10-CM

## 2015-05-05 DIAGNOSIS — N760 Acute vaginitis: Secondary | ICD-10-CM

## 2015-05-05 DIAGNOSIS — A499 Bacterial infection, unspecified: Secondary | ICD-10-CM | POA: Diagnosis not present

## 2015-05-05 DIAGNOSIS — Z01419 Encounter for gynecological examination (general) (routine) without abnormal findings: Secondary | ICD-10-CM | POA: Diagnosis not present

## 2015-05-05 DIAGNOSIS — G43019 Migraine without aura, intractable, without status migrainosus: Secondary | ICD-10-CM | POA: Diagnosis not present

## 2015-05-05 DIAGNOSIS — B9689 Other specified bacterial agents as the cause of diseases classified elsewhere: Secondary | ICD-10-CM

## 2015-05-05 MED ORDER — SUMATRIPTAN SUCCINATE 50 MG PO TABS: 50.0000 mg | ORAL_TABLET | ORAL | Status: DC | PRN

## 2015-05-05 MED ORDER — FLUCONAZOLE 150 MG PO TABS
150.0000 mg | ORAL_TABLET | Freq: Once | ORAL | Status: DC
Start: 2015-05-05 — End: 2016-03-19

## 2015-05-05 MED ORDER — VALACYCLOVIR HCL 500 MG PO TABS: 500.0000 mg | ORAL_TABLET | Freq: Every day | ORAL | Status: DC

## 2015-05-05 MED ORDER — IBUPROFEN 600 MG PO TABS: 600.0000 mg | ORAL_TABLET | Freq: Three times a day (TID) | ORAL | Status: DC

## 2015-05-05 MED ORDER — METRONIDAZOLE 0.75 % VA GEL: VAGINAL | Status: DC

## 2015-05-05 NOTE — Progress Notes (Signed)
Danielle [name] Apr 16, 1971 [id number]    History:    Presents for annual exam.  Monthly cycles/BTL. Normal Pap history. 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 Normal health screening labs at work, states hemoglobin A1c slightly elevated.   Past medical history, past surgical history, family history and social history were all reviewed and documented in the EPIC chart. Works for [employer], 42 year old daughter, sons are 4314 and 7 all well, older 2  have received gardasil. Parents diabetes and hypertension.  ROS:  A ROS was performed and pertinent positives and negatives are included.  Exam:  Filed Vitals:   05/05/15 1609  BP: 122/80    General appearance:  Normal Thyroid:  Symmetrical, normal in size, without palpable masses or nodularity. Respiratory  Auscultation:  Clear without wheezing or rhonchi Cardiovascular  Auscultation:  Regular rate, without rubs, murmurs or gallops  Edema/varicosities:  Not grossly evident Abdominal  Soft,nontender, without masses, guarding or rebound.  Liver/spleen:  No organomegaly noted  Hernia:  None appreciated  Skin  Inspection:  Grossly normal   Breasts: Examined lying and sitting.     Right: Without masses, retractions, discharge or axillary adenopathy.     Left: Without masses, retractions, discharge or axillary adenopathy. Gentitourinary   Inguinal/mons:  Normal without inguinal adenopathy  External genitalia:  Normal  BUS/Urethra/Skene's glands:  Normal  Vagina:  Mild erythema, wet prep positive for clues  Cervix:  Normal  Uterus:   normal in size, shape and contour.  Midline and mobile  Adnexa/parametria:     Rt: Without masses or tenderness.   Lt: Without masses or tenderness.  Anus and perineum: Normal  Digital rectal exam: Normal sphincter tone without palpated masses or tenderness  Assessment/Plan:  44 y.o. MBF G3 P3  for annual exam with complaint of vaginal irritation without  relief with Monistat.    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 Valtrex 500 by mouth daily prescription, proper use given and reviewed. MetroGel vaginal cream 1 applicator at bedtime 5, alcohol precautions reviewed, instructed to call if no relief. Encouraged regular exercise, calcium rich diet, avoid simple carbs, low glycemic diet reviewed.   Imitrex 50 mg onset of headache, repeat 2 hours if needed no more than 2 tablets in 24. Schedule appointment with neurologist for headache evaluation, if difficulty scheduling instructed to call. UA, Pap with HR HPV typing,     Harrington ChallengerYOUNG,Bradey Luzier J [facility] LouisWHNP, 4:49 PM 05/05/2015

## 2015-05-05 NOTE — Patient Instructions (Addendum)
Health Maintenance, Female Adopting a healthy lifestyle and getting preventive care can go a long way to promote health and wellness. Talk with your health care provider about what schedule of regular examinations is right for you. This is a good chance for you to check in with your provider about disease prevention and staying healthy. In between checkups, there are plenty of things you can do on your own. Experts have done a lot of research about which lifestyle changes and preventive measures are most likely to keep you healthy. Ask your health care provider for more information. WEIGHT AND DIET  Eat a healthy diet  Be sure to include plenty of vegetables, fruits, low-fat dairy products, and lean protein.  Do not eat a lot of foods high in solid fats, added sugars, or salt.  Get regular exercise. This is one of the most important things you can do for your health.  Most adults should exercise for at least 150 minutes each week. The exercise should increase your heart rate and make you sweat (moderate-intensity exercise).  Most adults should also do strengthening exercises at least twice a week. This is in addition to the moderate-intensity exercise.  Maintain a healthy weight  Body mass index (BMI) is a measurement that can be used to identify possible weight problems. It estimates body fat based on height and weight. Your health care provider can help determine your BMI and help you achieve or maintain a healthy weight.  For females 20 years of age and older:   A BMI below 18.5 is considered underweight.  A BMI of 18.5 to 24.9 is normal.  A BMI of 25 to 29.9 is considered overweight.  A BMI of 30 and above is considered obese.  Watch levels of cholesterol and blood lipids  You should start having your blood tested for lipids and cholesterol at 42 years of age, then have this test every 5 years.  You may need to have your cholesterol levels checked more often if:  Your lipid  or cholesterol levels are high.  You are older than 42 years of age.  You are at high risk for heart disease.  CANCER SCREENING   Lung Cancer  Lung cancer screening is recommended for adults 55-80 years old who are at high risk for lung cancer because of a history of smoking.  A yearly low-dose CT scan of the lungs is recommended for people who:  Currently smoke.  Have quit within the past 15 years.  Have at least a 30-pack-year history of smoking. A pack year is smoking an average of one pack of cigarettes a day for 1 year.  Yearly screening should continue until it has been 15 years since you quit.  Yearly screening should stop if you develop a health problem that would prevent you from having lung cancer treatment.  Breast Cancer  Practice breast self-awareness. This means understanding how your breasts normally appear and feel.  It also means doing regular breast self-exams. Let your health care provider know about any changes, no matter how small.  If you are in your 20s or 30s, you should have a clinical breast exam (CBE) by a health care provider every 1-3 years as part of a regular health exam.  If you are 40 or older, have a CBE every year. Also consider having a breast X-ray (mammogram) every year.  If you have a family history of breast cancer, talk to your health care provider about genetic screening.  If you   are at high risk for breast cancer, talk to your health care provider about having an MRI and a mammogram every year.  Breast cancer gene (BRCA) assessment is recommended for women who have family members with BRCA-related cancers. BRCA-related cancers include:  Breast.  Ovarian.  Tubal.  Peritoneal cancers.  Results of the assessment will determine the need for genetic counseling and BRCA1 and BRCA2 testing. Cervical Cancer Your health care provider may recommend that you be screened regularly for cancer of the pelvic organs (ovaries, uterus, and  vagina). This screening involves a pelvic examination, including checking for microscopic changes to the surface of your cervix (Pap test). You may be encouraged to have this screening done every 3 years, beginning at age 21.  For women ages 30-65, health care providers may recommend pelvic exams and Pap testing every 3 years, or they may recommend the Pap and pelvic exam, combined with testing for human papilloma virus (HPV), every 5 years. Some types of HPV increase your risk of cervical cancer. Testing for HPV may also be done on women of any age with unclear Pap test results.  Other health care providers may not recommend any screening for nonpregnant women who are considered low risk for pelvic cancer and who do not have symptoms. Ask your health care provider if a screening pelvic exam is right for you.  If you have had past treatment for cervical cancer or a condition that could lead to cancer, you need Pap tests and screening for cancer for at least 20 years after your treatment. If Pap tests have been discontinued, your risk factors (such as having a new sexual partner) need to be reassessed to determine if screening should resume. Some women have medical problems that increase the chance of getting cervical cancer. In these cases, your health care provider may recommend more frequent screening and Pap tests. Colorectal Cancer  This type of cancer can be detected and often prevented.  Routine colorectal cancer screening usually begins at 42 years of age and continues through 42 years of age.  Your health care provider may recommend screening at an earlier age if you have risk factors for colon cancer.  Your health care provider may also recommend using home test kits to check for hidden blood in the stool.  A small camera at the end of a tube can be used to examine your colon directly (sigmoidoscopy or colonoscopy). This is done to check for the earliest forms of colorectal  cancer.  Routine screening usually begins at age 50.  Direct examination of the colon should be repeated every 5-10 years through 42 years of age. However, you may need to be screened more often if early forms of precancerous polyps or small growths are found. Skin Cancer  Check your skin from head to toe regularly.  Tell your health care provider about any new moles or changes in moles, especially if there is a change in a mole's shape or color.  Also tell your health care provider if you have a mole that is larger than the size of a pencil eraser.  Always use sunscreen. Apply sunscreen liberally and repeatedly throughout the day.  Protect yourself by wearing long sleeves, pants, a wide-brimmed hat, and sunglasses whenever you are outside. HEART DISEASE, DIABETES, AND HIGH BLOOD PRESSURE   High blood pressure causes heart disease and increases the risk of stroke. High blood pressure is more likely to develop in:  People who have blood pressure in the high end   of the normal range (130-139/85-89 mm Hg).  People who are overweight or obese.  People who are African American.  If you are 38-23 years of age, have your blood pressure checked every 3-5 years. If you are 61 years of age or older, have your blood pressure checked every year. You should have your blood pressure measured twice--once when you are at a hospital or clinic, and once when you are not at a hospital or clinic. Record the average of the two measurements. To check your blood pressure when you are not at a hospital or clinic, you can use:  An automated blood pressure machine at a pharmacy.  A home blood pressure monitor.  If you are between 45 years and 39 years old, ask your health care provider if you should take aspirin to prevent strokes.  Have regular diabetes screenings. This involves taking a blood sample to check your fasting blood sugar level.  If you are at a normal weight and have a low risk for diabetes,  have this test once every three years after 42 years of age.  If you are overweight and have a high risk for diabetes, consider being tested at a younger age or more often. PREVENTING INFECTION  Hepatitis B  If you have a higher risk for hepatitis B, you should be screened for this virus. You are considered at high risk for hepatitis B if:  You were born in a country where hepatitis B is common. Ask your health care provider which countries are considered high risk.  Your parents were born in a high-risk country, and you have not been immunized against hepatitis B (hepatitis B vaccine).  You have HIV or AIDS.  You use needles to inject street drugs.  You live with someone who has hepatitis B.  You have had sex with someone who has hepatitis B.  You get hemodialysis treatment.  You take certain medicines for conditions, including cancer, organ transplantation, and autoimmune conditions. Hepatitis C  Blood testing is recommended for:  Everyone born from [birth year] through 1963.  Anyone with known risk factors for hepatitis C. Sexually transmitted infections (STIs)  You should be screened for sexually transmitted infections (STIs) including gonorrhea and chlamydia if:  You are sexually active and are younger than 42 years of age.  You are older than 42 years of age and your health care provider tells you that you are at risk for this type of infection.  Your sexual activity has changed since you were last screened and you are at an increased risk for chlamydia or gonorrhea. Ask your health care provider if you are at risk.  If you do not have HIV, but are at risk, it may be recommended that you take a prescription medicine daily to prevent HIV infection. This is called pre-exposure prophylaxis (PrEP). You are considered at risk if:  You are sexually active and do not regularly use condoms or know the HIV status of your partner(s).  You take drugs by injection.  You are sexually  active with a partner who has HIV. Talk with your health care provider about whether you are at high risk of being infected with HIV. If you choose to begin PrEP, you should first be tested for HIV. You should then be tested every 3 months for as long as you are taking PrEP.  PREGNANCY   If you are premenopausal and you may become pregnant, ask your health care provider about preconception counseling.  If you may  become pregnant, take 400 to 800 micrograms (mcg) of folic acid every day.  If you want to prevent pregnancy, talk to your health care provider about birth control (contraception). OSTEOPOROSIS AND MENOPAUSE   Osteoporosis is a disease in which the bones lose minerals and strength with aging. This can result in serious bone fractures. Your risk for osteoporosis can be identified using a bone density scan.  If you are 65 years of age or older, or if you are at risk for osteoporosis and fractures, ask your health care provider if you should be screened.  Ask your health care provider whether you should take a calcium or vitamin D supplement to lower your risk for osteoporosis.  Menopause may have certain physical symptoms and risks.  Hormone replacement therapy may reduce some of these symptoms and risks. Talk to your health care provider about whether hormone replacement therapy is right for you.  HOME CARE INSTRUCTIONS   Schedule regular health, dental, and eye exams.  Stay current with your immunizations.   Do not use any tobacco products including cigarettes, chewing tobacco, or electronic cigarettes.  If you are pregnant, do not drink alcohol.  If you are breastfeeding, limit how much and how often you drink alcohol.  Limit alcohol intake to no more than 1 drink per day for nonpregnant women. One drink equals 12 ounces of beer, 5 ounces of wine, or 1 ounces of hard liquor.  Do not use street drugs.  Do not share needles.  Ask your health care provider for help if  you need support or information about quitting drugs.  Tell your health care provider if you often feel depressed.  Tell your health care provider if you have ever been abused or do not feel safe at home.   This information is not intended to replace advice given to you by your health care provider. Make sure you discuss any questions you have with your health care provider.   Document Released: 01/03/2011 Document Revised: 07/11/2014 Document Reviewed: 05/22/2013 Elsevier Interactive Patient Education 2016 [employer] Diabetes Mellitus and Food It is important for you to manage your blood sugar (glucose) level. Your blood glucose level can be greatly affected by what you eat. Eating healthier foods in the appropriate amounts throughout the day at about the same time each day will help you control your blood glucose level. It can also help slow or prevent worsening of your diabetes mellitus. Healthy eating may even help you improve the level of your blood pressure and reach or maintain a healthy weight.  General recommendations for healthful eating and cooking habits include:  Eating meals and snacks regularly. Avoid going long periods of time without eating to lose weight.  Eating a diet that consists mainly of plant-based foods, such as fruits, vegetables, nuts, legumes, and whole grains.  Using low-heat cooking methods, such as baking, instead of high-heat cooking methods, such as deep frying. Work with your dietitian to make sure you understand how to use the Nutrition Facts information on food labels. HOW CAN FOOD AFFECT ME? Carbohydrates Carbohydrates affect your blood glucose level more than any other type of food. Your dietitian will help you determine how many carbohydrates to eat at each meal and teach you how to count carbohydrates. Counting carbohydrates is important to keep your blood glucose at a healthy level, especially if you are using insulin or taking certain medicines for  diabetes mellitus. Alcohol Alcohol can cause sudden decreases in blood glucose (hypoglycemia), especially if you   use insulin or take certain medicines for diabetes mellitus. Hypoglycemia can be a life-threatening condition. Symptoms of hypoglycemia (sleepiness, dizziness, and disorientation) are similar to symptoms of having too much alcohol.  If your health care provider has given you approval to drink alcohol, do so in moderation and use the following guidelines:  Women should not have more than one drink per day, and men should not have more than two drinks per day. One drink is equal to:  12 oz of beer.  5 oz of wine.  1 oz of hard liquor.  Do not drink on an empty stomach.  Keep yourself hydrated. Have water, diet soda, or unsweetened iced tea.  Regular soda, juice, and other mixers might contain a lot of carbohydrates and should be counted. WHAT FOODS ARE NOT RECOMMENDED? As you make food choices, it is important to remember that all foods are not the same. Some foods have fewer nutrients per serving than other foods, even though they might have the same number of calories or carbohydrates. It is difficult to get your body what it needs when you eat foods with fewer nutrients. Examples of foods that you should avoid that are high in calories and carbohydrates but low in nutrients include:  Trans fats (most processed foods list trans fats on the Nutrition Facts label).  Regular soda.  Juice.  Candy.  Sweets, such as cake, pie, doughnuts, and cookies.  Fried foods. WHAT FOODS CAN I EAT? Eat nutrient-rich foods, which will nourish your body and keep you healthy. The food you should eat also will depend on several factors, including:  The calories you need.  The medicines you take.  Your weight.  Your blood glucose level.  Your blood pressure level.  Your cholesterol level. You should eat a variety of foods, including:  Protein.  Lean cuts of meat.  Proteins low  in saturated fats, such as fish, egg whites, and beans. Avoid processed meats.  Fruits and vegetables.  Fruits and vegetables that may help control blood glucose levels, such as apples, mangoes, and yams.  Dairy products.  Choose fat-free or low-fat dairy products, such as milk, yogurt, and cheese.  Grains, bread, pasta, and rice.  Choose whole grain products, such as multigrain bread, whole oats, and brown rice. These foods may help control blood pressure.  Fats.  Foods containing healthful fats, such as nuts, avocado, olive oil, canola oil, and fish. DOES EVERYONE WITH DIABETES MELLITUS HAVE THE SAME MEAL PLAN? Because every person with diabetes mellitus is different, there is not one meal plan that works for everyone. It is very important that you meet with a dietitian who will help you create a meal plan that is just right for you.   This information is not intended to replace advice given to you by your health care provider. Make sure you discuss any questions you have with your health care provider.   Document Released: 03/17/2005 Document Revised: 07/11/2014 Document Reviewed: 05/17/2013 Elsevier Interactive Patient Education 2016 [employer] Monilial Vaginitis Vaginitis in a soreness, swelling and redness (inflammation) of the vagina and vulva. Monilial vaginitis is not a sexually transmitted infection. CAUSES  Yeast vaginitis is caused by yeast (candida) that is normally found in your vagina. With a yeast infection, the candida has overgrown in number to a point that upsets the chemical balance. SYMPTOMS   White, thick vaginal discharge.  Swelling, itching, redness and irritation of the vagina and possibly the lips of the vagina (vulva).  Burning or  painful urination.  Painful intercourse. DIAGNOSIS  Things that may contribute to monilial vaginitis are:  Postmenopausal and virginal states.  Pregnancy.  Infections.  Being tired, sick or stressed, especially if  you had monilial vaginitis in the past.  Diabetes. Good control will help lower the chance.  Birth control pills.  Tight fitting garments.  Using bubble bath, feminine sprays, douches or deodorant tampons.  Taking certain medications that kill germs (antibiotics).  Sporadic recurrence can occur if you become ill. TREATMENT  Your caregiver will give you medication.  There are several kinds of anti monilial vaginal creams and suppositories specific for monilial vaginitis. For recurrent yeast infections, use a suppository or cream in the vagina 2 times a week, or as directed.  Anti-monilial or steroid cream for the itching or irritation of the vulva may also be used. Get your caregiver's permission.  Painting the vagina with methylene blue solution may help if the monilial cream does not work.  Eating yogurt may help prevent monilial vaginitis. HOME CARE INSTRUCTIONS   Finish all medication as prescribed.  Do not have sex until treatment is completed or after your caregiver tells you it is okay.  Take warm sitz baths.  Do not douche.  Do not use tampons, especially scented ones.  Wear cotton underwear.  Avoid tight pants and panty hose.  Tell your sexual partner that you have a yeast infection. They should go to their caregiver if they have symptoms such as mild rash or itching.  Your sexual partner should be treated as well if your infection is difficult to eliminate.  Practice safer sex. Use condoms.  Some vaginal medications cause latex condoms to fail. Vaginal medications that harm condoms are:  Cleocin cream.  Butoconazole (Femstat).  Terconazole (Terazol) vaginal suppository.  Miconazole (Monistat) (may be purchased over the counter). SEEK MEDICAL CARE IF:   You have a temperature by mouth above 102 F (38.9 C).  The infection is getting worse after 2 days of treatment.  The infection is not getting better after 3 days of treatment.  You develop  blisters in or around your vagina.  You develop vaginal bleeding, and it is not your menstrual period.  You have pain when you urinate.  You develop intestinal problems.  You have pain with sexual intercourse.   This information is not intended to replace advice given to you by your health care provider. Make sure you discuss any questions you have with your health care provider.   Document Released: 03/30/2005 Document Revised: 09/12/2011 Document Reviewed: 12/22/2014 Elsevier Interactive Patient Education [employer].

## 2015-05-06 ENCOUNTER — Other Ambulatory Visit (HOSPITAL_COMMUNITY)
Admission: RE | Admit: 2015-05-06 | Discharge: 2015-05-06 | Disposition: A | Discharge status: Home / Self Care | Payer: Managed Care, Other (non HMO) | Source: Ambulatory Visit | Attending: Gynecology | Admitting: Gynecology

## 2015-05-06 DIAGNOSIS — Z01419 Encounter for gynecological examination (general) (routine) without abnormal findings: Secondary | ICD-10-CM | POA: Insufficient documentation

## 2015-05-06 LAB — URINALYSIS W MICROSCOPIC + REFLEX CULTURE
Bilirubin Urine: NEGATIVE
CASTS: NONE SEEN [LPF]
CRYSTALS: NONE SEEN [HPF]
Glucose, UA: NEGATIVE
HGB URINE DIPSTICK: NEGATIVE
KETONES UR: NEGATIVE
NITRITE: NEGATIVE
PROTEIN: NEGATIVE
Specific Gravity, Urine: 1.024 (ref 1.001–1.035)
YEAST: NONE SEEN [HPF]
pH: 7 (ref 5.0–8.0)

## 2015-05-06 NOTE — Addendum Note (Signed)
Addended by: Kem ParkinsonBARNES, Lennon Richins on: 05/06/2015 08:13 AM   Modules accepted: Orders

## 2015-05-07 LAB — URINE CULTURE
COLONY COUNT: NO GROWTH
Organism ID, Bacteria: NO GROWTH

## 2015-05-08 LAB — CYTOLOGY - PAP

## 2015-09-21 ENCOUNTER — Emergency Department (HOSPITAL_COMMUNITY)
Admission: EM | Admit: 2015-09-21 | Discharge: 2015-09-21 | Disposition: A | Discharge status: Home / Self Care | Payer: Managed Care, Other (non HMO) | Attending: Emergency Medicine | Admitting: Emergency Medicine

## 2015-09-21 ENCOUNTER — Encounter (HOSPITAL_COMMUNITY): Payer: Self-pay | Admitting: Emergency Medicine

## 2015-09-21 ENCOUNTER — Emergency Department (HOSPITAL_COMMUNITY): Payer: Managed Care, Other (non HMO)

## 2015-09-21 DIAGNOSIS — Z79899 Other long term (current) drug therapy: Secondary | ICD-10-CM | POA: Insufficient documentation

## 2015-09-21 DIAGNOSIS — K30 Functional dyspepsia: Secondary | ICD-10-CM | POA: Diagnosis not present

## 2015-09-21 DIAGNOSIS — R0789 Other chest pain: Secondary | ICD-10-CM | POA: Diagnosis not present

## 2015-09-21 DIAGNOSIS — Z8673 Personal history of transient ischemic attack (TIA), and cerebral infarction without residual deficits: Secondary | ICD-10-CM | POA: Diagnosis not present

## 2015-09-21 DIAGNOSIS — K2100 Gastro-esophageal reflux disease with esophagitis, without bleeding: Secondary | ICD-10-CM

## 2015-09-21 DIAGNOSIS — Z8742 Personal history of other diseases of the female genital tract: Secondary | ICD-10-CM | POA: Diagnosis not present

## 2015-09-21 DIAGNOSIS — Z791 Long term (current) use of non-steroidal anti-inflammatories (NSAID): Secondary | ICD-10-CM | POA: Diagnosis not present

## 2015-09-21 DIAGNOSIS — Z792 Long term (current) use of antibiotics: Secondary | ICD-10-CM | POA: Diagnosis not present

## 2015-09-21 DIAGNOSIS — Z8619 Personal history of other infectious and parasitic diseases: Secondary | ICD-10-CM | POA: Diagnosis not present

## 2015-09-21 DIAGNOSIS — R079 Chest pain, unspecified: Secondary | ICD-10-CM | POA: Diagnosis present

## 2015-09-21 LAB — CBC
HEMATOCRIT: 39.3 % (ref 36.0–46.0)
Hemoglobin: 13.2 g/dL (ref 12.0–15.0)
MCH: 29.6 pg (ref 26.0–34.0)
MCHC: 33.6 g/dL (ref 30.0–36.0)
MCV: 88.1 fL (ref 78.0–100.0)
Platelets: 338 10*3/uL (ref 150–400)
RBC: 4.46 MIL/uL (ref 3.87–5.11)
RDW: 12.5 % (ref 11.5–15.5)
WBC: 9.1 10*3/uL (ref 4.0–10.5)

## 2015-09-21 LAB — BASIC METABOLIC PANEL
Anion gap: 13 (ref 5–15)
BUN: 6 mg/dL (ref 6–20)
CHLORIDE: 103 mmol/L (ref 101–111)
CO2: 26 mmol/L (ref 22–32)
Calcium: 9.6 mg/dL (ref 8.9–10.3)
Creatinine, Ser: 0.78 mg/dL (ref 0.44–1.00)
GFR calc non Af Amer: >60 mL/min (ref >60)
GLUCOSE: 120 mg/dL — AB (ref 65–99)
POTASSIUM: 3.7 mmol/L (ref 3.5–5.1)
SODIUM: 142 mmol/L (ref 135–145)

## 2015-09-21 LAB — I-STAT TROPONIN, ED: Troponin i, poc: 0 ng/mL (ref 0.00–0.08)

## 2015-09-21 MED ORDER — OMEPRAZOLE 20 MG PO CPDR: 20.0000 mg | DELAYED_RELEASE_CAPSULE | Freq: Every day | ORAL | Status: DC

## 2015-09-21 MED ORDER — GI COCKTAIL ~~LOC~~
30.0000 mL | Freq: Once | ORAL | Status: AC
  Administered 2015-09-21: 30 mL via ORAL
  Filled 2015-09-21: qty 30

## 2015-09-21 NOTE — ED Notes (Signed)
Pt sts mid sternal CP x 4 days

## 2015-09-21 NOTE — Discharge Instructions (Signed)
Your chest pain is likely from gastritis or reflux. You will need to take prilosec as directed, and avoid spicy/fatty/acidic foods, avoid soda/coffee/tea. Avoid laying down flat within 30 minutes of eating. Avoid NSAIDs like ibuprofen/aleve/motrin/etc on an empty stomach. May consider using over the counter tums/maalox/zantac as needed for additional relief. Use tylenol as needed for pain. Follow up with the your regular doctor in one week for ongoing evaluation of your abdominal pain. Return to the ER for changes or worsening symptoms.   Gastroesophageal Reflux Disease, Adult Normally, food travels down the esophagus and stays in the stomach to be digested. However, when a person has gastroesophageal reflux disease (GERD), food and stomach acid move back up into the esophagus. When this happens, the esophagus becomes sore and inflamed. Over time, GERD can create small holes (ulcers) in the lining of the esophagus.  CAUSES This condition is caused by a problem with the muscle between the esophagus and the stomach (lower esophageal sphincter, or LES). Normally, the LES muscle closes after food passes through the esophagus to the stomach. When the LES is weakened or abnormal, it does not close properly, and that allows food and stomach acid to go back up into the esophagus. The LES can be weakened by certain dietary substances, medicines, and medical conditions, including:  Tobacco use.  Pregnancy.  Having a hiatal hernia.  Heavy alcohol use.  Certain foods and beverages, such as coffee, chocolate, onions, and peppermint. RISK FACTORS This condition is more likely to develop in:  People who have an increased body weight.  People who have connective tissue disorders.  People who use NSAID medicines. SYMPTOMS Symptoms of this condition include:  Heartburn.  Difficult or painful swallowing.  The feeling of having a lump in the throat.  Abitter taste in the mouth.  Bad  breath.  Having a large amount of saliva.  Having an upset or bloated stomach.  Belching.  Chest pain.  Shortness of breath or wheezing.  Ongoing (chronic) cough or a night-time cough.  Wearing away of tooth enamel.  Weight loss. Different conditions can cause chest pain. Make sure to see your health care provider if you experience chest pain. DIAGNOSIS Your health care provider will take a medical history and perform a physical exam. To determine if you have mild or severe GERD, your health care provider may also monitor how you respond to treatment. You may also have other tests, including:  An endoscopy toexamine your stomach and esophagus with a small camera.  A test thatmeasures the acidity level in your esophagus.  A test thatmeasures how much pressure is on your esophagus.  A barium swallow or modified barium swallow to show the shape, size, and functioning of your esophagus. TREATMENT The goal of treatment is to help relieve your symptoms and to prevent complications. Treatment for this condition may vary depending on how severe your symptoms are. Your health care provider may recommend:  Changes to your diet.  Medicine.  Surgery. HOME CARE INSTRUCTIONS Diet  Follow a diet as recommended by your health care provider. This may involve avoiding foods and drinks such as:  Coffee and tea (with or without caffeine).  Drinks that containalcohol.  Energy drinks and sports drinks.  Carbonated drinks or sodas.  Chocolate and cocoa.  Peppermint and mint flavorings.  Garlic and onions.  Horseradish.  Spicy and acidic foods, including peppers, chili powder, curry powder, vinegar, hot sauces, and barbecue sauce.  Citrus fruit juices and citrus fruits, such as oranges,  lemons, and limes.  Tomato-based foods, such as red sauce, chili, salsa, and pizza with red sauce.  Fried and fatty foods, such as donuts, french fries, potato chips, and high-fat  dressings.  High-fat meats, such as hot dogs and fatty cuts of red and white meats, such as rib eye steak, sausage, ham, and bacon.  High-fat dairy items, such as whole milk, butter, and cream cheese.  Eat small, frequent meals instead of large meals.  Avoid drinking large amounts of liquid with your meals.  Avoid eating meals during the 2-3 hours before bedtime.  Avoid lying down right after you eat.  Do not exercise right after you eat. General Instructions  Pay attention to any changes in your symptoms.  Take over-the-counter and prescription medicines only as told by your health care provider. Do not take aspirin, ibuprofen, or other NSAIDs unless your health care provider told you to do so.  Do not use any tobacco products, including cigarettes, chewing tobacco, and e-cigarettes. If you need help quitting, ask your health care provider.  Wear loose-fitting clothing. Do not wear anything tight around your waist that causes pressure on your abdomen.  Raise (elevate) the head of your bed 6 inches (15cm).  Try to reduce your stress, such as with yoga or meditation. If you need help reducing stress, ask your health care provider.  If you are overweight, reduce your weight to an amount that is healthy for you. Ask your health care provider for guidance about a safe weight loss goal.  Keep all follow-up visits as told by your health care provider. This is important. SEEK MEDICAL CARE IF:  You have new symptoms.  You have unexplained weight loss.  You have difficulty swallowing, or it hurts to swallow.  You have wheezing or a persistent cough.  Your symptoms do not improve with treatment.  You have a hoarse voice. SEEK IMMEDIATE MEDICAL CARE IF:  You have pain in your arms, neck, jaw, teeth, or back.  You feel sweaty, dizzy, or light-headed.  You have chest pain or shortness of breath.  You vomit and your vomit looks like blood or coffee grounds.  You  faint.  Your stool is bloody or black.  You cannot swallow, drink, or eat.   This information is not intended to replace advice given to you by your health care provider. Make sure you discuss any questions you have with your health care provider.   Document Released: 03/30/2005 Document Revised: 03/11/2015 Document Reviewed: 10/15/2014 Elsevier Interactive Patient Education 2016 [employer].  Food Choices for Gastroesophageal Reflux Disease, Adult When you have gastroesophageal reflux disease (GERD), the foods you eat and your eating habits are very important. Choosing the right foods can help ease the discomfort of GERD. WHAT GENERAL GUIDELINES DO I NEED TO FOLLOW?  Choose fruits, vegetables, whole grains, low-fat dairy products, and low-fat meat, fish, and poultry.  Limit fats such as oils, salad dressings, butter, nuts, and avocado.  Keep a food diary to identify foods that cause symptoms.  Avoid foods that cause reflux. These may be different for different people.  Eat frequent small meals instead of three large meals each day.  Eat your meals slowly, in a relaxed setting.  Limit fried foods.  Cook foods using methods other than frying.  Avoid drinking alcohol.  Avoid drinking large amounts of liquids with your meals.  Avoid bending over or lying down until 2-3 hours after eating. WHAT FOODS ARE NOT RECOMMENDED? The following are some foods  and drinks that may worsen your symptoms: Vegetables Tomatoes. Tomato juice. Tomato and spaghetti sauce. Chili peppers. Onion and garlic. Horseradish. Fruits Oranges, grapefruit, and lemon (fruit and juice). Meats High-fat meats, fish, and poultry. This includes hot dogs, ribs, ham, sausage, salami, and bacon. Dairy Whole milk and chocolate milk. Sour cream. Cream. Butter. Ice cream. Cream cheese.  Beverages Coffee and tea, with or without caffeine. Carbonated beverages or energy drinks. Condiments Hot sauce. Barbecue sauce.   Sweets/Desserts Chocolate and cocoa. Donuts. Peppermint and spearmint. Fats and Oils High-fat foods, including [place] fries and potato chips. Other Vinegar. Strong spices, such as black pepper, white pepper, red pepper, cayenne, curry powder, cloves, ginger, and chili powder. The items listed above may not be a complete list of foods and beverages to avoid. Contact your dietitian for more information.   This information is not intended to replace advice given to you by your health care provider. Make sure you discuss any questions you have with your health care provider.   Document Released: 06/20/2005 Document Revised: 07/11/2014 Document Reviewed: 04/24/2013 Elsevier Interactive Patient Education 2016 [employer]  Nonspecific Chest Pain  Chest pain can be caused by many different conditions. There is always a chance that your pain could be related to something serious, such as a heart attack or a blood clot in your lungs. Chest pain can also be caused by conditions that are not life-threatening. If you have chest pain, it is very important to follow up with your health care provider. CAUSES  Chest pain can be caused by:  Heartburn.  Pneumonia or bronchitis.  Anxiety or stress.  Inflammation around your heart (pericarditis) or lung (pleuritis or pleurisy).  A blood clot in your lung.  A collapsed lung (pneumothorax). It can develop suddenly on its own (spontaneous pneumothorax) or from trauma to the chest.  Shingles infection (varicella-zoster virus).  Heart attack.  Damage to the bones, muscles, and cartilage that make up your chest wall. This can include:  Bruised bones due to injury.  Strained muscles or cartilage due to frequent or repeated coughing or overwork.  Fracture to one or more ribs.  Sore cartilage due to inflammation (costochondritis). RISK FACTORS  Risk factors for chest pain may include:  Activities that increase your risk for trauma or injury to  your chest.  Respiratory infections or conditions that cause frequent coughing.  Medical conditions or overeating that can cause heartburn.  Heart disease or family history of heart disease.  Conditions or health behaviors that increase your risk of developing a blood clot.  Having had chicken pox (varicella zoster). SIGNS AND SYMPTOMS Chest pain can feel like:  Burning or tingling on the surface of your chest or deep in your chest.  Crushing, pressure, aching, or squeezing pain.  Dull or sharp pain that is worse when you move, cough, or take a deep breath.  Pain that is also felt in your back, neck, shoulder, or arm, or pain that spreads to any of these areas. Your chest pain may come and go, or it may stay constant. DIAGNOSIS Lab tests or other studies may be needed to find the cause of your pain. Your health care provider may have you take a test called an ambulatory ECG (electrocardiogram). An ECG records your heartbeat patterns at the time the test is performed. You may also have other tests, such as:  Transthoracic echocardiogram (TTE). During echocardiography, sound waves are used to create a picture of all of the heart structures and to  look at how blood flows through your heart.  Transesophageal echocardiogram (TEE).This is a more advanced imaging test that obtains images from inside your body. It allows your health care provider to see your heart in finer detail.  Cardiac monitoring. This allows your health care provider to monitor your heart rate and rhythm in real time.  Holter monitor. This is a portable device that records your heartbeat and can help to diagnose abnormal heartbeats. It allows your health care provider to track your heart activity for several days, if needed.  Stress tests. These can be done through exercise or by taking medicine that makes your heart beat more quickly.  Blood tests.  Imaging tests. TREATMENT  Your treatment depends on what is  causing your chest pain. Treatment may include:  Medicines. These may include:  Acid blockers for heartburn.  Anti-inflammatory medicine.  Pain medicine for inflammatory conditions.  Antibiotic medicine, if an infection is present.  Medicines to dissolve blood clots.  Medicines to treat coronary artery disease.  Supportive care for conditions that do not require medicines. This may include:  Resting.  Applying heat or cold packs to injured areas.  Limiting activities until pain decreases. HOME CARE INSTRUCTIONS  If you were prescribed an antibiotic medicine, finish it all even if you start to feel better.  Avoid any activities that bring on chest pain.  Do not use any tobacco products, including cigarettes, chewing tobacco, or electronic cigarettes. If you need help quitting, ask your health care provider.  Do not drink alcohol.  Take medicines only as directed by your health care provider.  Keep all follow-up visits as directed by your health care provider. This is important. This includes any further testing if your chest pain does not go away.  If heartburn is the cause for your chest pain, you may be told to keep your head raised (elevated) while sleeping. This reduces the chance that acid will go from your stomach into your esophagus.  Make lifestyle changes as directed by your health care provider. These may include:  Getting regular exercise. Ask your health care provider to suggest some activities that are safe for you.  Eating a heart-healthy diet. A registered dietitian can help you to learn healthy eating options.  Maintaining a healthy weight.  Managing diabetes, if necessary.  Reducing stress. SEEK MEDICAL CARE IF:  Your chest pain does not go away after treatment.  You have a rash with blisters on your chest.  You have a fever. SEEK IMMEDIATE MEDICAL CARE IF:   Your chest pain is worse.  You have an increasing cough, or you cough up  blood.  You have severe abdominal pain.  You have severe weakness.  You faint.  You have chills.  You have sudden, unexplained chest discomfort.  You have sudden, unexplained discomfort in your arms, back, neck, or jaw.  You have shortness of breath at any time.  You suddenly start to sweat, or your skin gets clammy.  You feel nauseous or you vomit.  You suddenly feel light-headed or dizzy.  Your heart begins to beat quickly, or it feels like it is skipping beats. These symptoms may represent a serious problem that is an emergency. Do not wait to see if the symptoms will go away. Get medical help right away. Call your local emergency services (911 in the [place] Do not drive yourself to the hospital.   This information is not intended to replace advice given to you by your health care provider.  Make sure you discuss any questions you have with your health care provider.   Document Released: 03/30/2005 Document Revised: 07/11/2014 Document Reviewed: 01/24/2014 Elsevier Interactive Patient Education 2016 [employer] Inc[employer]  Indigestion Indigestion is a feeling of pain, discomfort, burning, or fullness in the upper part of your abdomen. It can come and go. It may occur frequently or rarely. Indigestion tends to occur while you are eating or right after you have finished eating. It may be worse at night and while bending over or lying down. HOME CARE INSTRUCTIONS Take these actions to decrease your pain or discomfort and to help avoid complications. Diet  Follow a diet as recommended by your health care provider. This may involve avoiding foods and drinks such as:  Coffee and tea (with or without caffeine).  Drinks that contain alcohol.  Energy drinks and sports drinks.  Carbonated drinks or sodas.  Chocolate and cocoa.  Peppermint and mint flavorings.  Garlic and onions.  Horseradish.  Spicy and acidic foods, including peppers, chili powder, curry powder, vinegar, hot  sauces, and barbecue sauce.  Citrus fruit juices and citrus fruits, such as oranges, lemons, and limes.  Tomato-based foods, such as red sauce, chili, salsa, and pizza with red sauce.  Fried and fatty foods, such as donuts, french fries, potato chips, and high-fat dressings.  High-fat meats, such as hot dogs and fatty cuts of red and white meats, such as rib eye steak, sausage, ham, and bacon.  High-fat dairy items, such as whole milk, butter, and cream cheese.  Eat small, frequent meals instead of large meals.  Avoid drinking large amounts of liquid with your meals.  Avoid eating meals during the 2-3 hours before bedtime.  Avoid lying down right after you eat.  Do not exercise right after you eat. General Instructions  Pay attention to any changes in your symptoms.  Take over-the-counter and prescription medicines only as told by your health care provider. Do not take aspirin, ibuprofen, or other NSAIDs unless your health care provider told you to do so.  Do not use any tobacco products, including cigarettes, chewing tobacco, and e-cigarettes. If you need help quitting, ask your health care provider.  Wear loose-fitting clothing. Do not wear anything tight around your waist that causes pressure on your abdomen.  Raise (elevate) the head of your bed about 6 inches (15 cm).  Try to reduce your stress, such as with yoga or meditation. If you need help reducing stress, ask your health care provider.  If you are overweight, reduce your weight to an amount that is healthy for you. Ask your health care provider for guidance about a safe weight loss goal.  Keep all follow-up visits as told by your health care provider. This is important. SEEK MEDICAL CARE IF:  You have new symptoms.  You have unexplained weight loss.  You have difficulty swallowing, or it hurts to swallow.  Your symptoms do not improve with treatment.  Your symptoms last for more than two days.  You have a  fever.  You vomit. SEEK IMMEDIATE MEDICAL CARE IF:  You have pain in your arms, neck, jaw, teeth, or back.  You feel sweaty, dizzy, or light-headed.  You faint.  You have chest pain or shortness of breath.  You cannot stop vomiting, or you vomit blood.  Your stool is bloody or black.  You have severe pain in your abdomen.   This information is not intended to replace advice given to you by your health care provider.  Make sure you discuss any questions you have with your health care provider.   Document Released: 07/28/2004 Document Revised: 03/11/2015 Document Reviewed: 10/15/2014 Elsevier Interactive Patient Education [employer]

## 2015-09-21 NOTE — ED Provider Notes (Signed)
CSN: [id number]     Arrival date & time 09/21/15  1153 History   First MD Initiated Contact with Patient 09/21/15 1614     Chief Complaint  Patient presents with  . Chest Pain     (Consider location/radiation/quality/duration/timing/severity/associated sxs/prior Treatment) HPI Comments: Danielle [name] is a 45 y.o. Female who presents to the ED with complaints of gradual onset substernal chest pain 4 days that began initially while at rest. She describes the pain is 6/10 constant throbbing centralized nonradiating chest pain worse with eating any foods, and unrelieved with Zantac. She admits to having a poor diet which includes fatty and fried foods as well as fast foods. Her mother reports that she has a history of "a small heart attack" that did not require any stents. No other family history of heart problems.  Patient denies any fevers, chills, diaphoresis, lightheadedness, shortness breath, leg swelling, recent travel/surgery/immobilization, estrogen use, personal or family history of DVT/PE, abdominal pain, nausea, vomiting, diarrhea, constipation, dysuria, hematuria, numbness, tingling, weakness, claudication, or orthopnea. She denies any chronic NSAID use or alcohol use. She is a nonsmoker. She has never had anything like this before. She is otherwise healthy.  Patient is a 45 y.o. female presenting with chest pain. The history is provided by the patient. No language interpreter was used.  Chest Pain Pain location:  Substernal area Pain quality: throbbing   Pain radiates to:  Does not radiate Pain radiates to the back: no   Pain severity:  Moderate Onset quality:  Gradual Duration:  4 days Timing:  Constant Progression:  Unchanged Chronicity:  New Context: at rest   Relieved by:  Nothing Exacerbated by: eating. Ineffective treatments:  Antacids Associated symptoms: no abdominal pain, no claudication, no diaphoresis, no fever, no lower extremity edema, no nausea, no  numbness, no orthopnea, no shortness of breath, not vomiting and no weakness   Risk factors: no birth control, no diabetes mellitus, no high cholesterol, no hypertension, no immobilization, no prior DVT/PE, no smoking and no surgery     Past Medical History  Diagnosis Date  . Bacterial vaginosis     RECURRENT   Past Surgical History  Procedure Laterality Date  . Tubal ligation    . Incise and drain abcess  [id number]    LEFT LABIAL-SKENE   . Cesarean section     Family History  Problem Relation Age of Onset  . Diabetes Mother   . Hypertension Mother   . Diabetes Father    Social History  Substance Use Topics  . Smoking status: Never Smoker   . Smokeless tobacco: Never Used  . Alcohol Use: Yes     Comment: occassionally   OB History    Gravida Para Term Preterm AB TAB SAB Ectopic Multiple Living   6 3   2 1 1   3      Review of Systems  Constitutional: Negative for fever, chills and diaphoresis.  Respiratory: Negative for shortness of breath.   Cardiovascular: Positive for chest pain. Negative for orthopnea, claudication and leg swelling.  Gastrointestinal: Negative for nausea, vomiting, abdominal pain, diarrhea and constipation.  Genitourinary: Negative for dysuria and hematuria.  Musculoskeletal: Negative for myalgias and arthralgias.  Skin: Negative for color change.  Allergic/Immunologic: Negative for immunocompromised state.  Neurological: Negative for weakness, light-headedness and numbness.  Psychiatric/Behavioral: Negative for confusion.   10 Systems reviewed and are negative for acute change except as noted in the HPI.    Allergies  Septra  Home Medications  Prior to Admission medications   Medication Sig Start Date End Date Taking? Authorizing Provider  Butalbital-APAP-Caffeine 50-300-40 MG CAPS as needed. 04/24/15  Yes Historical Provider, MD  RaNITidine HCl (ZANTAC PO) Take 1 tablet by mouth daily as needed (for heartburn).   Yes Historical Provider, MD   SUMAtriptan (IMITREX) 50 MG tablet Take 1 tablet (50 mg total) by mouth every 2 (two) hours as needed for migraine. May repeat in 2 hours if headache persists or recurs. 05/05/15  Yes Harrington Challenger, NP  valACYclovir (VALTREX) 500 MG tablet Take 1 tablet (500 mg total) by mouth daily. 05/05/15  Yes Harrington Challenger, NP  cyclobenzaprine (FLEXERIL) 5 MG tablet Take 1 tablet (5 mg total) by mouth 3 (three) times daily. 02/24/15   Earley Favor, NP  fluconazole (DIFLUCAN) 150 MG tablet Take 1 tablet (150 mg total) by mouth once. 05/05/15   Harrington Challenger, NP  ibuprofen (ADVIL,MOTRIN) 600 MG tablet Take 1 tablet (600 mg total) by mouth 3 (three) times daily. 05/05/15   Harrington Challenger, NP  metroNIDAZOLE (METROGEL VAGINAL) 0.75 % vaginal gel 1 applicator per vagina at HS x 5 05/05/15   Harrington Challenger, NP  SOLODYN 80 MG TB24 as directed. 05/04/15   Historical Provider, MD   BP 126/80 mmHg  Pulse 84  Temp(Src) 98.6 F (37 C) (Oral)  Resp 17  SpO2 100% Physical Exam  Constitutional: She is oriented to person, place, and time. Vital signs are normal. She appears well-developed and well-nourished.  Non-toxic appearance. No distress.  Afebrile, nontoxic, NAD  HENT:  Head: Normocephalic and atraumatic.  Mouth/Throat: Oropharynx is clear and moist and mucous membranes are normal.  Eyes: Conjunctivae and EOM are normal. Right eye exhibits no discharge. Left eye exhibits no discharge.  Neck: Normal range of motion. Neck supple.  Cardiovascular: Normal rate, regular rhythm, normal heart sounds and intact distal pulses.  Exam reveals no gallop and no friction rub.   No murmur heard. RRR, nl s1/s2, no m/r/g, distal pulses intact, no pedal edema   Pulmonary/Chest: Effort normal and breath sounds normal. No respiratory distress. She has no decreased breath sounds. She has no wheezes. She has no rhonchi. She has no rales. She exhibits tenderness. She exhibits no crepitus, no deformity and no retraction.    CTAB in all lung  fields, no w/r/r, no hypoxia or increased WOB, speaking in full sentences, SpO2 100% on RA Chest wall with mild TTP over sternum and L upper chest, without crepitus, deformities, or retractions   Abdominal: Soft. Normal appearance and bowel sounds are normal. She exhibits no distension. There is no tenderness. There is no rigidity, no rebound, no guarding, no CVA tenderness, no tenderness at McBurney's point and negative Murphy's sign.  Soft, NTND, +BS throughout, no r/g/r, neg murphy's, neg mcburney's, no CVA TTP   Musculoskeletal: Normal range of motion.  MAE x4 Strength and sensation grossly intact Distal pulses intact No pedal edema  Neurological: She is alert and oriented to person, place, and time. She has normal strength. No sensory deficit.  Skin: Skin is warm, dry and intact. No rash noted.  Psychiatric: She has a normal mood and affect.  Nursing note and vitals reviewed.   ED Course  Procedures (including critical care time) Labs Review Labs Reviewed  BASIC METABOLIC PANEL - Abnormal; Notable for the following:    Glucose, Bld 120 (*)    All other components within normal limits  CBC  I-STAT TROPOININ, ED  Imaging Review Dg Chest 2 View  09/21/2015  CLINICAL DATA:  Mid chest pain onset X4 days ago; fatigue onset today; ex-smoker EXAM: CHEST  2 VIEW COMPARISON:  None. FINDINGS: The heart size and mediastinal contours are within normal limits. Both lungs are clear. The visualized skeletal structures are unremarkable. IMPRESSION: No active cardiopulmonary disease. Electronically Signed   By: Esperanza Heir M.D.   On: 09/21/2015 13:31   I have personally reviewed and evaluated these images and lab results as part of my medical decision-making.   EKG Interpretation   Date/Time:  Monday September 21 2015 12:15:02 EDT Ventricular Rate:  89 PR Interval:  164 QRS Duration: 80 QT Interval:  332 QTC Calculation: 403 R Axis:   83 Text Interpretation:  Normal sinus rhythm Cannot  rule out Anterior infarct  , age undetermined Abnormal ECG Confirmed by Lincoln Brigham [phone]) on  09/21/2015 3:56:18 PM      MDM   Final diagnoses:  Atypical chest pain  Indigestion  Gastroesophageal reflux disease with esophagitis    45 y.o. female here with CP x4 days, worse with eating. Admits to poor diet with lots of fried/fatty/acidic foods. CP is reproducible on exam. No LE swelling, no hypoxia or tachycardia, no SOB complaint, doubt PE. Doubt dissection. Work up today is unremarkable, EKG without acute ischemic changes, CXR WNL, labs WNL. Trop neg at 4 day mark, doubt need for repeat testing, low-risk HEART score. Likely GI related. Will give GI cocktail then likely d/c home with PPI and discussed diet modifications. Will reassess shortly  5:41 PM Some relief with the GI cocktail, which helps confirm that it's likely GI related. Will start on PPI, continue zantac, use PRN maalox/tums, and do diet modifications. F/up with PCP in 1wk. I explained the diagnosis and have given explicit precautions to return to the ER including for any other new or worsening symptoms. The patient understands and accepts the medical plan as it's been dictated and I have answered their questions. Discharge instructions concerning home care and prescriptions have been given. The patient is STABLE and is discharged to home in good condition.  BP 125/75 mmHg  Pulse 72  Temp(Src) 98.6 F (37 C) (Oral)  Resp 22  SpO2 99%  Meds ordered this encounter  Medications  . gi cocktail (Maalox,Lidocaine,Donnatal)    Sig:   . omeprazole (PRILOSEC) 20 MG capsule    Sig: Take 1 capsule (20 mg total) by mouth daily.    Dispense:  30 capsule    Refill:  0    Order Specific Question:  Supervising Provider    Answer:  Eber Hong [[id number]]      Gunther Zawadzki Camprubi-Soms, PA-C 09/21/15 1742  Tilden Fossa, MD 09/21/15 2120

## 2015-11-24 ENCOUNTER — Ambulatory Visit: Payer: Managed Care, Other (non HMO) | Admitting: Gastroenterology

## 2015-12-29 LAB — WET PREP FOR TRICH, YEAST, CLUE

## 2016-03-19 ENCOUNTER — Other Ambulatory Visit: Payer: Self-pay | Admitting: Women's Health

## 2016-03-19 DIAGNOSIS — B373 Candidiasis of vulva and vagina: Secondary | ICD-10-CM

## 2016-03-19 DIAGNOSIS — B3731 Acute candidiasis of vulva and vagina: Secondary | ICD-10-CM

## 2016-03-21 NOTE — Telephone Encounter (Signed)
Ok for refill, office visit if no relief 

## 2016-05-18 ENCOUNTER — Ambulatory Visit (INDEPENDENT_AMBULATORY_CARE_PROVIDER_SITE_OTHER): Payer: Managed Care, Other (non HMO) | Admitting: Women's Health

## 2016-05-18 ENCOUNTER — Encounter: Payer: Self-pay | Admitting: Women's Health

## 2016-05-18 VITALS — BP 122/82 | Ht 65.0 in | Wt 173.4 lb

## 2016-05-18 DIAGNOSIS — Z01419 Encounter for gynecological examination (general) (routine) without abnormal findings: Secondary | ICD-10-CM

## 2016-05-18 DIAGNOSIS — N760 Acute vaginitis: Secondary | ICD-10-CM

## 2016-05-18 DIAGNOSIS — B373 Candidiasis of vulva and vagina: Secondary | ICD-10-CM | POA: Diagnosis not present

## 2016-05-18 DIAGNOSIS — B3731 Acute candidiasis of vulva and vagina: Secondary | ICD-10-CM

## 2016-05-18 DIAGNOSIS — Z23 Encounter for immunization: Secondary | ICD-10-CM

## 2016-05-18 DIAGNOSIS — G43019 Migraine without aura, intractable, without status migrainosus: Secondary | ICD-10-CM | POA: Diagnosis not present

## 2016-05-18 DIAGNOSIS — B9689 Other specified bacterial agents as the cause of diseases classified elsewhere: Secondary | ICD-10-CM

## 2016-05-18 DIAGNOSIS — A609 Anogenital herpesviral infection, unspecified: Secondary | ICD-10-CM | POA: Diagnosis not present

## 2016-05-18 LAB — WET PREP FOR TRICH, YEAST, CLUE
Trich, Wet Prep: NONE SEEN
Yeast Wet Prep HPF POC: NONE SEEN

## 2016-05-18 MED ORDER — CEPHALEXIN 500 MG PO CAPS: 500.0000 mg | ORAL_CAPSULE | Freq: Three times a day (TID) | ORAL | 0 refills | Status: DC

## 2016-05-18 MED ORDER — SUMATRIPTAN SUCCINATE 50 MG PO TABS: 50.0000 mg | ORAL_TABLET | ORAL | 3 refills | Status: DC | PRN

## 2016-05-18 MED ORDER — VALACYCLOVIR HCL 500 MG PO TABS: ORAL_TABLET | ORAL | 4 refills | Status: DC

## 2016-05-18 MED ORDER — FLUCONAZOLE 150 MG PO TABS: 150.0000 mg | ORAL_TABLET | Freq: Once | ORAL | 1 refills | Status: AC

## 2016-05-18 MED ORDER — METRONIDAZOLE 0.75 % VA GEL: VAGINAL | 1 refills | Status: DC

## 2016-05-18 NOTE — Patient Instructions (Signed)

## 2016-05-18 NOTE — Progress Notes (Signed)
Danielle [name] Jan 11, 1971 [id number]    History:    Presents for annual exam. Monthly cycle/ BTL. Normal Pap history. 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 Concerned she has one currently that has been draining clear and bloody fluid for 6 months despite taking valtrex daily. Denies abdominal pain, urinary symptoms, discharge, or pruritis. 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 Notes night sweats.  Past medical history, past surgical history, family history and social history were all reviewed and documented in the EPIC chart. Works for [employer], 43 year old daughter, sons are 5715 and 8. Parents with diabetes and hypertension  ROS:  A ROS was performed and pertinent positives and negatives are included.  Exam:  Vitals:   05/18/16 1618  BP: 122/82  Weight: 173 lb 6.4 oz (78.7 kg)  Height: 5\' 5"  (1.651 m)   Body mass index is 28.86 kg/m.   General appearance:  Normal Thyroid:  Symmetrical, normal in size, without palpable masses or nodularity. Respiratory  Auscultation:  Clear without wheezing or rhonchi Cardiovascular  Auscultation:  Regular rate, without rubs, murmurs or gallops  Edema/varicosities:  Not grossly evident Abdominal  Soft,nontender, without masses, guarding or rebound.  Liver/spleen:  No organomegaly noted  Hernia:  None appreciated  Skin  Inspection:  Grossly normal   Breasts: Examined lying and sitting.     Right: Without masses, retractions, discharge or axillary adenopathy.     Left: Without masses, retractions, discharge or axillary adenopathy. Gentitourinary   Inguinal/mons:  Normal without inguinal adenopathy  External genitalia:  Left groin 2 cm erythematous area folliculitis  BUS/Urethra/Skene's glands:  Normal  Vagina:  Wet prep positive for clues, TNTC bacteria  Cervix:  Normal  Uterus:  normal in size, shape and contour.  Midline and mobile  Adnexa/parametria:     Rt: Without masses or  tenderness.   Lt: Without masses or tenderness.  Anus and perineum: Normal  Digital rectal exam: Normal sphincter tone without palpated masses or tenderness  Assessment/Plan:  45 y.o. MBF G6 P3 for annual exam with complaint of a sore in groin area for 6 months.  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 Valtrex 500 by mouth daily prescription, proper use reviewed. MetroGel vaginal cream one applicator at bedtime X5. Alcohol precautions reviewed. Instructed to call if no relief. Encouraged regular exercise, calcium rich diet, avoid simple carbs, low glycemic diet reviewed. Imitrex 50 mg onset of headaches, repeat 2 hours if needed no more than 2 tablets in 24 hours. Keflex prescription, 500 mg 3 times daily for 5 days instructed to start abx if follicle fills with fluid again.  Harrington ChallengerYOUNG,Danielle [name] [facility] LLCWHNP, 4:47 PM 05/18/2016

## 2016-05-19 NOTE — Addendum Note (Signed)
Addended by: Aura CampsWEBB, JENNIFER L on: 05/19/2016 08:08 AM   Modules accepted: Orders

## 2016-05-20 ENCOUNTER — Other Ambulatory Visit: Payer: Self-pay | Admitting: Women's Health

## 2016-05-20 ENCOUNTER — Other Ambulatory Visit: Payer: Managed Care, Other (non HMO)

## 2016-05-20 DIAGNOSIS — Z833 Family history of diabetes mellitus: Secondary | ICD-10-CM

## 2016-05-20 DIAGNOSIS — Z0000 Encounter for general adult medical examination without abnormal findings: Secondary | ICD-10-CM

## 2016-05-20 DIAGNOSIS — Z13220 Encounter for screening for lipoid disorders: Secondary | ICD-10-CM

## 2016-05-20 LAB — HEMOGLOBIN A1C
HEMOGLOBIN A1C: 5.7 % — AB (ref <5.7)
Mean Plasma Glucose: 117 mg/dL

## 2016-06-13 ENCOUNTER — Encounter: Payer: Self-pay | Admitting: Gynecology

## 2016-06-13 ENCOUNTER — Ambulatory Visit (INDEPENDENT_AMBULATORY_CARE_PROVIDER_SITE_OTHER): Payer: Managed Care, Other (non HMO) | Admitting: Gynecology

## 2016-06-13 VITALS — BP 118/80 | Ht 65.0 in | Wt 173.0 lb

## 2016-06-13 DIAGNOSIS — N764 Abscess of vulva: Secondary | ICD-10-CM | POA: Diagnosis not present

## 2016-06-13 DIAGNOSIS — R350 Frequency of micturition: Secondary | ICD-10-CM

## 2016-06-13 DIAGNOSIS — N898 Other specified noninflammatory disorders of vagina: Secondary | ICD-10-CM | POA: Diagnosis not present

## 2016-06-13 LAB — URINALYSIS W MICROSCOPIC + REFLEX CULTURE
BILIRUBIN URINE: NEGATIVE
CASTS: NONE SEEN [LPF]
CRYSTALS: NONE SEEN [HPF]
Glucose, UA: NEGATIVE
HGB URINE DIPSTICK: NEGATIVE
KETONES UR: NEGATIVE
NITRITE: NEGATIVE
RBC / HPF: NONE SEEN RBC/HPF (ref <=2)
SPECIFIC GRAVITY, URINE: 1.025 (ref 1.001–1.035)
pH: 7 (ref 5.0–8.0)

## 2016-06-13 LAB — WET PREP FOR TRICH, YEAST, CLUE: Trich, Wet Prep: NONE SEEN

## 2016-06-13 MED ORDER — DOXYCYCLINE HYCLATE 100 MG PO CAPS: 100.0000 mg | ORAL_CAPSULE | Freq: Two times a day (BID) | ORAL | 0 refills | Status: DC

## 2016-06-13 MED ORDER — FLUCONAZOLE 150 MG PO TABS: 150.0000 mg | ORAL_TABLET | Freq: Every day | ORAL | 0 refills | Status: DC

## 2016-06-13 MED ORDER — CLINDAMYCIN PHOSPHATE 2 % VA CREA: 1.0000 | TOPICAL_CREAM | Freq: Every day | VAGINAL | 0 refills | Status: DC

## 2016-06-13 NOTE — Progress Notes (Signed)
    Danielle [name] 11-19-1970 [id number]        45 y.o.  [id number] presents with three-month history of draining vulvar areas. They come and go but never completely go away. Will enlarge and then rupture and drain. Was treated with Keflex by Danielle [name] in November for 5 days. Also treated for bacterial vaginosis and yeast with MetroGel and Diflucan.  No areas noted under her arms. Has noticed over the last several weeks increasing discharge with irritation as well as urinary frequency and mild dysuria. No fever or chills. No nausea vomiting diarrhea constipation.   Past medical history,surgical history, problem list, medications, allergies, family history and social history were all reviewed and documented in the EPIC chart.  Directed ROS with pertinent positives and negatives documented in the history of present illness/assessment and plan.  Exam: Danielle MantisKim [name] assistant Vitals:   06/13/16 1444  BP: 118/80  Weight: 173 lb (78.5 kg)  Height: 5\' 5"  (1.651 m)   General appearance:  Normal Abdomen soft nontender without masses guarding rebound LV external BUS vagina with straining small pustule left crease of thigh. Larger 2 cm subcutaneous area consistent with cyst/boil non-pointing. No overt evidence of cellulitis or inguinal adenopathy. Thick cottage cheese type discharge. Cervix normal. Uterus grossly normal midline mobile nontender. Adnexa without masses or tenderness Physical Exam  Genitourinary:        Assessment/Plan:  45 y.o. [id number] with:  1. Recurrent vulvar boils. Larger area not pointing. Will cover with Vibramycin 100 mg twice a day 10 days. Warm compresses to the area. Call if area persists or recurs and I will refer to general surgery to consider a more aggressive I&D. 2. Vaginal discharge. Wet prep is positive for yeast and bacterial vaginosis. Will treat with Cleocin vaginal cream nightly 7 nights at her preference and Diflucan 150 mg daily 3 days. Will use a little  longer course of Diflucan to hopefully eradicate the infection. 3. Urinary frequency/dysuria. Urinalysis appears contaminated. Will follow up on culture and treat accordingly if positive.    Danielle [name],Danielle Wormley P MD, 2:59 PM 06/13/2016

## 2016-06-13 NOTE — Patient Instructions (Addendum)
Take the doxycycline antibiotic twice daily for 10 days. Put warm compresses over the sore areas. Take the Diflucan pill daily for 3 days Use the Cleocin vaginal cream nightly for 7 nights.  Call if your irritation continues.

## 2016-06-13 NOTE — Addendum Note (Signed)
Addended by: BARNES, Siri Buege on: 06/13/2016 04:32 PM   Modules accepted: Orders  

## 2016-06-15 ENCOUNTER — Other Ambulatory Visit: Payer: Self-pay | Admitting: Gynecology

## 2016-06-15 LAB — URINE CULTURE

## 2016-06-15 MED ORDER — AMPICILLIN 500 MG PO CAPS: 500.0000 mg | ORAL_CAPSULE | Freq: Four times a day (QID) | ORAL | 0 refills | Status: DC

## 2016-10-12 ENCOUNTER — Telehealth: Payer: Self-pay | Admitting: *Deleted

## 2016-10-12 NOTE — Telephone Encounter (Signed)
Pt called to follow up from OV 06/13/2016 recurrent boil has returned, pt would like to proceed with general surgery consult. referral will be made

## 2016-10-12 NOTE — Telephone Encounter (Signed)
Okay for general surgical referral reference recurrent perineal boils

## 2016-10-12 NOTE — Telephone Encounter (Signed)
Referral faxed to [facility] they will fax me back with time and date to relay to patient.

## 2016-10-14 NOTE — Telephone Encounter (Signed)
Pt scheduled with Dr.Comer on 10/17/16 @ 2:20pm

## 2016-10-17 ENCOUNTER — Ambulatory Visit: Payer: Self-pay | Admitting: Surgery

## 2016-10-17 NOTE — H&P (Signed)
Surgical H&P  CC: perineal boils  HPI: Nice woman who has been having issues with "boils" in her perineum since about September. No procedures, but has been on multiple abx. Had a bartholins abscess I&Dd many years ago.  The lesions she has now will swell and then go down, have drained on occasion. Currently on abx. No systemic symptoms. Otherwise relatively healthy.   Allergies  Allergen Reactions  . Septra [Bactrim] Nausea And Vomiting    Past Medical History:  Diagnosis Date  . Bacterial vaginosis    RECURRENT  . HSV (herpes simplex virus) anogenital infection   . Migraines     Past Surgical History:  Procedure Laterality Date  . CESAREAN SECTION    . INCISE AND DRAIN ABCESS  [id number]   LEFT LABIAL-SKENE   . TUBAL LIGATION      Family History  Problem Relation Age of Onset  . Diabetes Mother   . Hypertension Mother   . Diabetes Father     Social History   Social History  . Marital status: Married    Spouse name: N/A  . Number of children: N/A  . Years of education: N/A   Social History Main Topics  . Smoking status: Never Smoker  . Smokeless tobacco: Never Used  . Alcohol use No  . Drug use: No  . Sexual activity: Yes    Birth control/ protection: Surgical     Comment: TUBAL LIGATION   Other Topics Concern  . Not on file   Social History Narrative  . No narrative on file    Current Outpatient Prescriptions on File Prior to Visit  Medication Sig Dispense Refill  . ampicillin (PRINCIPEN) 500 MG capsule Take 1 capsule (500 mg total) by mouth QID. 20 capsule 0  . Butalbital-APAP-Caffeine 50-300-40 MG CAPS Take 1 tablet by mouth daily as needed (FOR MIGRAINES).   0  . clindamycin (CLEOCIN) 2 % vaginal cream Place 1 Applicatorful vaginally at bedtime. 40 g 0  . doxycycline (VIBRAMYCIN) 100 MG capsule Take 1 capsule (100 mg total) by mouth 2 (two) times daily. 20 capsule 0  . fluconazole (DIFLUCAN) 150 MG tablet Take 1 tablet (150 mg total) by mouth daily. 3  tablet 0  . RaNITidine HCl (ZANTAC PO) Take 1 tablet by mouth daily as needed (for heartburn).    . SUMAtriptan (IMITREX) 50 MG tablet Take 1 tablet (50 mg total) by mouth every 2 (two) hours as needed for migraine. May repeat in 2 hours if headache persists or recurs, no more than 2 tablets in 24 hours. 10 tablet 3  . valACYclovir (VALTREX) 500 MG tablet Take twice daily 180 tablet 4   No current facility-administered medications on file prior to visit.     Review of Systems: a complete, 10pt review of systems was completed with pertinent positives and negatives as documented in the HPI.   Physical Exam: There were no vitals filed for this visit. Gen: A&Ox3, no distress  Head: normocephalic, atraumatic, EOMI, anicteric.  Neck: supple without mass or thyromegaly Chest: unlabored respirations symmetrical air entry  Cardiovascular: RRR with palpable distal pulses Abdomen: soft, nontender, nondistended, no mass or organomegaly Extremities: warm, without edema, no deformities  Neuro: grossly intact, normal gait Psych: appropriate mood and affect appropriate insight Skin: There is a 3cm cyst in the right upper inner thigh, a 1cm cyst on the left posterior labia, and a 1cm cyst on the left upper inner thigh. Mildly tender, no drainage or erythema currently  CBC  Latest Ref Rng & Units 09/21/2015 04/30/2014 01/28/2013  WBC 4.0 - 10.5 K/uL 9.1 10.4 8.8  Hemoglobin 12.0 - 15.0 g/dL 16.1 09.6 04.5  Hematocrit 36.0 - 46.0 % 39.3 38.3 38.1  Platelets 150 - 400 K/uL 338 393 326    CMP Latest Ref Rng & Units 09/21/2015 04/30/2014 01/28/2013  Glucose 65 - 99 mg/dL 409(W) 90 91  BUN 6 - 20 mg/dL 6 9 -  Creatinine 1.19 - 1.00 mg/dL 1.47 8.29 -  Sodium 562 - 145 mmol/L 142 139 -  Potassium 3.5 - 5.1 mmol/L 3.7 4.0 -  Chloride 101 - 111 mmol/L 103 102 -  CO2 22 - 32 mmol/L 26 27 -  Calcium 8.9 - 10.3 mg/dL 9.6 9.1 -  Total Protein 6.0 - 8.3 g/dL - 7.2 -  Total Bilirubin 0.2 - 1.2 mg/dL - 0.3 -   Alkaline Phos 39 - 117 U/L - 67 -  AST 0 - 37 U/L - 12 -  ALT 0 - 35 U/L - 11 -    No results found for: INR, PROTIME  Imaging: n/a  A/P: 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 Will plan excision in OR   Phylliss Blakes, MD [facility], [place] Pager [phone]

## 2016-11-16 ENCOUNTER — Encounter: Payer: Self-pay | Admitting: Gynecology

## 2017-01-26 ENCOUNTER — Other Ambulatory Visit: Payer: Self-pay | Admitting: Family Medicine

## 2017-01-26 ENCOUNTER — Ambulatory Visit
Admission: RE | Admit: 2017-01-26 | Discharge: 2017-01-26 | Disposition: A | Discharge status: Home / Self Care | Payer: Managed Care, Other (non HMO) | Source: Ambulatory Visit | Attending: Family Medicine | Admitting: Family Medicine

## 2017-01-26 DIAGNOSIS — R05 Cough: Secondary | ICD-10-CM

## 2017-01-26 DIAGNOSIS — R059 Cough, unspecified: Secondary | ICD-10-CM

## 2017-02-23 ENCOUNTER — Ambulatory Visit: Payer: Self-pay | Admitting: Surgery

## 2017-03-17 ENCOUNTER — Encounter (HOSPITAL_BASED_OUTPATIENT_CLINIC_OR_DEPARTMENT_OTHER): Payer: Self-pay | Admitting: *Deleted

## 2017-03-24 ENCOUNTER — Ambulatory Visit (HOSPITAL_BASED_OUTPATIENT_CLINIC_OR_DEPARTMENT_OTHER): Admission: RE | Admit: 2017-03-24 | Payer: 59 | Source: Ambulatory Visit | Admitting: Surgery

## 2017-03-24 ENCOUNTER — Encounter (HOSPITAL_BASED_OUTPATIENT_CLINIC_OR_DEPARTMENT_OTHER): Admission: RE | Payer: Self-pay | Source: Ambulatory Visit

## 2017-03-24 SURGERY — CYST REMOVAL
Anesthesia: Choice

## 2017-04-17 ENCOUNTER — Encounter (HOSPITAL_BASED_OUTPATIENT_CLINIC_OR_DEPARTMENT_OTHER): Payer: Self-pay | Admitting: *Deleted

## 2017-04-18 ENCOUNTER — Ambulatory Visit: Payer: Self-pay | Admitting: Surgery

## 2017-04-21 ENCOUNTER — Encounter (HOSPITAL_BASED_OUTPATIENT_CLINIC_OR_DEPARTMENT_OTHER): Payer: Self-pay | Admitting: Anesthesiology

## 2017-04-21 ENCOUNTER — Ambulatory Visit (HOSPITAL_BASED_OUTPATIENT_CLINIC_OR_DEPARTMENT_OTHER)
Admission: RE | Admit: 2017-04-21 | Discharge: 2017-04-21 | Disposition: A | Discharge status: Home / Self Care | Payer: 59 | Source: Ambulatory Visit | Attending: Surgery | Admitting: Surgery

## 2017-04-21 ENCOUNTER — Ambulatory Visit (HOSPITAL_BASED_OUTPATIENT_CLINIC_OR_DEPARTMENT_OTHER): Payer: 59 | Admitting: Anesthesiology

## 2017-04-21 ENCOUNTER — Encounter (HOSPITAL_BASED_OUTPATIENT_CLINIC_OR_DEPARTMENT_OTHER)
Admission: RE | Disposition: A | Discharge status: Home / Self Care | Payer: Self-pay | Source: Ambulatory Visit | Attending: Surgery

## 2017-04-21 DIAGNOSIS — N907 Vulvar cyst: Secondary | ICD-10-CM | POA: Diagnosis not present

## 2017-04-21 DIAGNOSIS — L723 Sebaceous cyst: Secondary | ICD-10-CM | POA: Diagnosis present

## 2017-04-21 DIAGNOSIS — L720 Epidermal cyst: Secondary | ICD-10-CM | POA: Diagnosis not present

## 2017-04-21 DIAGNOSIS — Z882 Allergy status to sulfonamides status: Secondary | ICD-10-CM | POA: Insufficient documentation

## 2017-04-21 HISTORY — PX: CYST EXCISION: SHX5701

## 2017-04-21 SURGERY — CYST REMOVAL
Anesthesia: General | Site: Perineum

## 2017-04-21 MED ORDER — FENTANYL CITRATE (PF) 100 MCG/2ML IJ SOLN
25.0000 ug | INTRAMUSCULAR | Status: DC | PRN
  Administered 2017-04-21 (×2): 25 ug via INTRAVENOUS

## 2017-04-21 MED ORDER — KETOROLAC TROMETHAMINE 30 MG/ML IJ SOLN
INTRAMUSCULAR | Status: AC
  Filled 2017-04-21: qty 1

## 2017-04-21 MED ORDER — CEFAZOLIN SODIUM-DEXTROSE 2-4 GM/100ML-% IV SOLN
2.0000 g | INTRAVENOUS | Status: AC
  Administered 2017-04-21: 2 g via INTRAVENOUS

## 2017-04-21 MED ORDER — FENTANYL CITRATE (PF) 100 MCG/2ML IJ SOLN
INTRAMUSCULAR | Status: AC
  Filled 2017-04-21: qty 2

## 2017-04-21 MED ORDER — BUPIVACAINE HCL (PF) 0.25 % IJ SOLN
INTRAMUSCULAR | Status: DC | PRN
  Administered 2017-04-21: 7 mL

## 2017-04-21 MED ORDER — CHLORHEXIDINE GLUCONATE 4 % EX LIQD: 60.0000 mL | Freq: Once | CUTANEOUS | Status: DC

## 2017-04-21 MED ORDER — LACTATED RINGERS IV SOLN
INTRAVENOUS | Status: DC
  Administered 2017-04-21: 07:00:00 via INTRAVENOUS

## 2017-04-21 MED ORDER — 0.9 % SODIUM CHLORIDE (POUR BTL) OPTIME
TOPICAL | Status: DC | PRN
  Administered 2017-04-21: 1000 mL

## 2017-04-21 MED ORDER — HYDROMORPHONE HCL 1 MG/ML IJ SOLN: 0.2500 mg | INTRAMUSCULAR | Status: DC | PRN

## 2017-04-21 MED ORDER — GABAPENTIN 300 MG PO CAPS
ORAL_CAPSULE | ORAL | Status: AC
  Filled 2017-04-21: qty 1

## 2017-04-21 MED ORDER — DEXAMETHASONE SODIUM PHOSPHATE 4 MG/ML IJ SOLN
INTRAMUSCULAR | Status: DC | PRN
  Administered 2017-04-21: 10 mg via INTRAVENOUS

## 2017-04-21 MED ORDER — PROPOFOL 10 MG/ML IV BOLUS
INTRAVENOUS | Status: DC | PRN
  Administered 2017-04-21: 180 mg via INTRAVENOUS

## 2017-04-21 MED ORDER — LIDOCAINE 2% (20 MG/ML) 5 ML SYRINGE
INTRAMUSCULAR | Status: DC | PRN
  Administered 2017-04-21: 40 mg via INTRAVENOUS

## 2017-04-21 MED ORDER — CEFAZOLIN SODIUM-DEXTROSE 2-4 GM/100ML-% IV SOLN
INTRAVENOUS | Status: AC
  Filled 2017-04-21: qty 100

## 2017-04-21 MED ORDER — PROPOFOL 500 MG/50ML IV EMUL
INTRAVENOUS | Status: AC
  Filled 2017-04-21: qty 50

## 2017-04-21 MED ORDER — CLINDAMYCIN HCL 300 MG PO CAPS: 300.0000 mg | ORAL_CAPSULE | Freq: Three times a day (TID) | ORAL | 0 refills | Status: AC

## 2017-04-21 MED ORDER — BUPIVACAINE-EPINEPHRINE (PF) 0.25% -1:200000 IJ SOLN
INTRAMUSCULAR | Status: AC
  Filled 2017-04-21: qty 30

## 2017-04-21 MED ORDER — MIDAZOLAM HCL 2 MG/2ML IJ SOLN
1.0000 mg | INTRAMUSCULAR | Status: DC | PRN
Start: 2017-04-21 — End: 2017-04-21
  Administered 2017-04-21: 2 mg via INTRAVENOUS

## 2017-04-21 MED ORDER — DOCUSATE SODIUM 100 MG PO CAPS: 100.0000 mg | ORAL_CAPSULE | Freq: Two times a day (BID) | ORAL | 0 refills | Status: AC

## 2017-04-21 MED ORDER — MIDAZOLAM HCL 2 MG/2ML IJ SOLN
INTRAMUSCULAR | Status: AC
  Filled 2017-04-21: qty 2

## 2017-04-21 MED ORDER — ACETAMINOPHEN 500 MG PO TABS
ORAL_TABLET | ORAL | Status: AC
  Filled 2017-04-21: qty 2

## 2017-04-21 MED ORDER — OXYCODONE-ACETAMINOPHEN 5-325 MG PO TABS: 1.0000 | ORAL_TABLET | Freq: Four times a day (QID) | ORAL | 0 refills | Status: DC | PRN

## 2017-04-21 MED ORDER — BUPIVACAINE HCL (PF) 0.25 % IJ SOLN
INTRAMUSCULAR | Status: AC
  Filled 2017-04-21: qty 30

## 2017-04-21 MED ORDER — SCOPOLAMINE 1 MG/3DAYS TD PT72: 1.0000 | MEDICATED_PATCH | Freq: Once | TRANSDERMAL | Status: DC | PRN

## 2017-04-21 MED ORDER — DEXAMETHASONE SODIUM PHOSPHATE 10 MG/ML IJ SOLN
INTRAMUSCULAR | Status: AC
  Filled 2017-04-21: qty 1

## 2017-04-21 MED ORDER — GABAPENTIN 300 MG PO CAPS
300.0000 mg | ORAL_CAPSULE | ORAL | Status: AC
  Administered 2017-04-21: 300 mg via ORAL

## 2017-04-21 MED ORDER — FENTANYL CITRATE (PF) 100 MCG/2ML IJ SOLN
50.0000 ug | INTRAMUSCULAR | Status: DC | PRN
  Administered 2017-04-21: 100 ug via INTRAVENOUS

## 2017-04-21 MED ORDER — SCOPOLAMINE 1 MG/3DAYS TD PT72: 1.0000 | MEDICATED_PATCH | TRANSDERMAL | Status: DC

## 2017-04-21 MED ORDER — ACETAMINOPHEN 500 MG PO TABS
1000.0000 mg | ORAL_TABLET | ORAL | Status: AC
  Administered 2017-04-21: 1000 mg via ORAL

## 2017-04-21 MED ORDER — ONDANSETRON HCL 4 MG/2ML IJ SOLN
INTRAMUSCULAR | Status: AC
  Filled 2017-04-21: qty 2

## 2017-04-21 MED ORDER — PROMETHAZINE HCL 25 MG/ML IJ SOLN: 6.2500 mg | INTRAMUSCULAR | Status: DC | PRN

## 2017-04-21 MED ORDER — LIDOCAINE 2% (20 MG/ML) 5 ML SYRINGE
INTRAMUSCULAR | Status: AC
  Filled 2017-04-21: qty 5

## 2017-04-21 SURGICAL SUPPLY — 33 items
BLADE SURG 15 STRL LF DISP TIS (BLADE) ×1 IMPLANT
BLADE SURG 15 STRL SS (BLADE) ×2
COVER MAYO STAND STRL (DRAPES) ×3 IMPLANT
DECANTER SPIKE VIAL GLASS SM (MISCELLANEOUS) ×3 IMPLANT
ELECT COATED BLADE 2.86 ST (ELECTRODE) IMPLANT
ELECT REM PT RETURN 9FT ADLT (ELECTROSURGICAL) ×3
ELECTRODE REM PT RTRN 9FT ADLT (ELECTROSURGICAL) ×1 IMPLANT
GAUZE SPONGE 4X4 12PLY STRL LF (GAUZE/BANDAGES/DRESSINGS) ×3 IMPLANT
GLOVE BIO SURGEON STRL SZ 6 (GLOVE) ×3 IMPLANT
GLOVE BIOGEL PI IND STRL 6.5 (GLOVE) ×1 IMPLANT
GLOVE BIOGEL PI IND STRL 7.0 (GLOVE) ×1 IMPLANT
GLOVE BIOGEL PI INDICATOR 6.5 (GLOVE) ×2
GLOVE BIOGEL PI INDICATOR 7.0 (GLOVE) ×2
GLOVE ECLIPSE 6.5 STRL STRAW (GLOVE) ×3 IMPLANT
GOWN STRL REUS W/ TWL LRG LVL3 (GOWN DISPOSABLE) ×2 IMPLANT
GOWN STRL REUS W/TWL LRG LVL3 (GOWN DISPOSABLE) ×4
NEEDLE HYPO 25X1 1.5 SAFETY (NEEDLE) ×3 IMPLANT
NS IRRIG 1000ML POUR BTL (IV SOLUTION) ×3 IMPLANT
PACK BASIN DAY SURGERY FS (CUSTOM PROCEDURE TRAY) ×3 IMPLANT
PENCIL BUTTON HOLSTER BLD 10FT (ELECTRODE) ×3 IMPLANT
SLEEVE SCD COMPRESS KNEE MED (MISCELLANEOUS) ×3 IMPLANT
SUT ETHILON 2 0 FS 18 (SUTURE) IMPLANT
SUT MNCRL AB 4-0 PS2 18 (SUTURE) IMPLANT
SUT SILK 2 0 SH (SUTURE) IMPLANT
SUT VIC AB 2-0 SH 27 (SUTURE)
SUT VIC AB 2-0 SH 27XBRD (SUTURE) IMPLANT
SUT VIC AB 3-0 SH 27 (SUTURE) ×2
SUT VIC AB 3-0 SH 27X BRD (SUTURE) ×1 IMPLANT
SUT VICRYL 3-0 CR8 SH (SUTURE) IMPLANT
SUT VICRYL 4-0 PS2 18IN ABS (SUTURE) IMPLANT
SYR CONTROL 10ML LL (SYRINGE) ×3 IMPLANT
TOWEL OR NON WOVEN STRL DISP B (DISPOSABLE) ×3 IMPLANT
UNDERPAD 30X30 (UNDERPADS AND DIAPERS) ×3 IMPLANT

## 2017-04-21 NOTE — Discharge Instructions (Signed)
GENERAL SURGERY: POST OP INSTRUCTIONS  ######################################################################  EAT Gradually transition to a high fiber diet with a fiber supplement over the next few weeks after discharge.  Start with a pureed / full liquid diet (see below)  WALK Walk an hour a day.  Control your pain to do that.    CONTROL PAIN Control pain so that you can walk, sleep, tolerate sneezing/coughing, go up/down stairs.  HAVE A BOWEL MOVEMENT DAILY Keep your bowels regular to avoid problems.  OK to try a laxative to override constipation.  OK to use an antidairrheal to slow down diarrhea.  Call if not better after 2 tries  CALL IF YOU HAVE PROBLEMS/CONCERNS Call if you are still struggling despite following these instructions. Call if you have concerns not answered by these instructions  ######################################################################    1. DIET: Follow a light bland diet the first 24 hours after arrival home, such as soup, liquids, crackers, etc.  Be sure to include lots of fluids daily.  Avoid fast food or heavy meals as your are more likely to get nauseated.   2. Take your usually prescribed home medications unless otherwise directed. 3. PAIN CONTROL: a. Pain is best controlled by a usual combination of three different methods TOGETHER: i. Ice/Heat ii. Over the counter pain medication iii. Prescription pain medication b. Most patients will experience some swelling and bruising around the incisions.  Ice packs or heating pads (30-60 minutes up to 6 times a day) will help. Use ice for the first few days to help decrease swelling and bruising, then switch to heat to help relax tight/sore spots and speed recovery.  Some people prefer to use ice alone, heat alone, alternating between ice & heat.  Experiment to what works for you.  Swelling and bruising can take several weeks to resolve.   c. It is helpful to take an over-the-counter pain medication  regularly for the first few weeks.  Choose one of the following that works best for you: i. Naproxen (Aleve, etc)  Two 220mg  tabs twice a day ii. Ibuprofen (Advil, etc) Three 200mg  tabs four times a day (every meal & bedtime) iii. Acetaminophen (Tylenol, etc) 500-650mg  four times a day (every meal & bedtime) d. A  prescription for pain medication (such as oxycodone, hydrocodone, etc) should be given to you upon discharge.  Take your pain medication as prescribed.  i. If you are having problems/concerns with the prescription medicine (does not control pain, nausea, vomiting, rash, itching, etc), please call us [phone] to see if we need to switch you to a different pain medicine that will work better for you and/or control your side effect better. ii. If you need a refill on your pain medication, please contact your pharmacy.  They will contact our office to request authorization. Prescriptions will not be filled after 5 pm or on week-ends. 4. Avoid getting constipated.  Between the surgery and the pain medications, it is common to experience some constipation.  Increasing fluid intake and taking a fiber supplement (such as Metamucil, Citrucel, FiberCon, MiraLax, etc) 1-2 times a day regularly will usually help prevent this problem from occurring.  A mild laxative (prune juice, Milk of Magnesia, MiraLax, etc) should be taken according to package directions if there are no bowel movements after 48 hours.   5. Wash / shower every day.  You may shower over the dressings as they are waterproof.  Continue to shower over incision(s) after the dressing is off. 6.  You may leave  the incision open to air.  You may have skin tapes (Steri Strips) covering the incision(s).  Leave them on until one week, then remove.  You may replace a dressing/Band-Aid to cover the incision for comfort if you wish.      7. ACTIVITIES as tolerated:   a. You may resume regular (light) daily activities beginning the next  day--such as daily self-care, walking, climbing stairs--gradually increasing activities as tolerated.  If you can walk 30 minutes without difficulty, it is safe to try more intense activity such as jogging, treadmill, bicycling, low-impact aerobics, swimming, etc. b. Save the most intensive and strenuous activity for last such as sit-ups, heavy lifting, contact sports, etc  Refrain from any heavy lifting or straining until you are off narcotics for pain control.   c. DO NOT PUSH THROUGH PAIN.  Let pain be your guide: If it hurts to do something, don't do it.  Pain is your body warning you to avoid that activity for another week until the pain goes down. d. You may drive when you are no longer taking prescription pain medication, you can comfortably wear a seatbelt, and you can safely maneuver your car and apply brakes. e. Bonita Quin may have sexual intercourse when it is comfortable.  8. FOLLOW UP in our office a. Please call [facility] at [phone] to set up an appointment to see your surgeon in the office for a follow-up appointment approximately 2-3 weeks after your surgery. b. Make sure that you call for this appointment the day you arrive home to insure a convenient appointment time. 9. IF YOU HAVE DISABILITY OR FAMILY LEAVE FORMS, BRING THEM TO THE OFFICE FOR PROCESSING.  DO NOT GIVE THEM TO YOUR DOCTOR.   WHEN TO CALL us [phone]: 1. Poor pain control 2. Reactions / problems with new medications (rash/itching, nausea, etc)  3. Fever over 101.5 F (38.5 C) 4. Worsening swelling or bruising 5. Continued bleeding from incision. 6. Increased pain, redness, or drainage from the incision 7. Difficulty breathing / swallowing   The clinic staff is available to answer your questions during regular business hours (8:30am-5pm).  Please dont hesitate to call and ask to speak to one of our nurses for clinical concerns.   If you have a medical emergency, go to the nearest emergency room or call 911.  A  surgeon from [facility] is always on call at the [facility], [place] [address], [place], [place]  [address] ? MAIN: [phone] ? TOLL FREE: [phone] ?  FAX [phone] [email or web address]     Post Anesthesia Home Care Instructions  Activity: Get plenty of rest for the remainder of the day. A responsible individual must stay with you for 24 hours following the procedure.  For the next 24 hours, DO NOT: -Drive a car -Advertising copywriter -Drink alcoholic beverages -Take any medication unless instructed by your physician -Make any legal decisions or sign important papers.  Meals: Start with liquid foods such as gelatin or soup. Progress to regular foods as tolerated. Avoid greasy, spicy, heavy foods. If nausea and/or vomiting occur, drink only clear liquids until the nausea and/or vomiting subsides. Call your physician if vomiting continues.  Special Instructions/Symptoms: Your throat may feel dry or sore from the anesthesia or the breathing tube placed in your throat during surgery. If this causes discomfort, gargle with warm salt water. The discomfort should disappear within 24 hours.  If you had a scopolamine patch  placed behind your ear for the management of post- operative nausea and/or vomiting:  1. The medication in the patch is effective for 72 hours, after which it should be removed.  Wrap patch in a tissue and discard in the trash. Wash hands thoroughly with soap and water. 2. You may remove the patch earlier than 72 hours if you experience unpleasant side effects which may include dry mouth, dizziness or visual disturbances. 3. Avoid touching the patch. Wash your hands with soap and water after contact with the patch.

## 2017-04-21 NOTE — Anesthesia Postprocedure Evaluation (Signed)
Anesthesia Post Note  Patient: Danielle [name]  Procedure(s) Performed: EXCISION OF SEBACEOUS CYST OF BILATERAL THIGHS AND LEFT LABIA (N/A Perineum)     Patient location during evaluation: PACU Anesthesia Type: General Level of consciousness: sedated Pain management: pain level controlled Vital Signs Assessment: post-procedure vital signs reviewed and stable Respiratory status: spontaneous breathing and respiratory function stable Cardiovascular status: stable Postop Assessment: no apparent nausea or vomiting Anesthetic complications: no    Last Vitals:  Vitals:   04/21/17 1045 04/21/17 1052  BP: 124/83   Pulse: 74 74  Resp:    Temp:    SpO2: 100% 99%    Last Pain:  Vitals:   04/21/17 1045  TempSrc:   PainSc: 4                  [name]

## 2017-04-21 NOTE — Anesthesia Preprocedure Evaluation (Addendum)
Anesthesia Evaluation  Patient identified by MRN, date of birth, ID band Patient awake    Reviewed: Allergy & Precautions, NPO status , Patient's Chart, lab work & pertinent test results  History of Anesthesia Complications Negative for: history of anesthetic complications  Airway Mallampati: II  TM Distance: >3 FB Neck ROM: Full    Dental no notable dental hx. (+) Dental Advisory Given   Pulmonary neg pulmonary ROS,    Pulmonary exam normal        Cardiovascular negative cardio ROS Normal cardiovascular exam     Neuro/Psych negative neurological ROS  negative psych ROS   GI/Hepatic negative GI ROS, Neg liver ROS,   Endo/Other  negative endocrine ROS  Renal/GU negative Renal ROS  negative genitourinary   Musculoskeletal negative musculoskeletal ROS (+)   Abdominal   Peds negative pediatric ROS (+)  Hematology negative hematology ROS (+)   Anesthesia Other Findings   Reproductive/Obstetrics                             Anesthesia Physical Anesthesia Plan  ASA: II  Anesthesia Plan: General   Post-op Pain Management:    Induction: Intravenous  PONV Risk Score and Plan: 2 and Ondansetron, Dexamethasone and Scopolamine patch - Pre-op  Airway Management Planned: LMA  Additional Equipment:   Intra-op Plan:   Post-operative Plan: Extubation in OR  Informed Consent: I have reviewed the patients History and Physical, chart, labs and discussed the procedure including the risks, benefits and alternatives for the proposed anesthesia with the patient or authorized representative who has indicated his/her understanding and acceptance.   Dental advisory given  Plan Discussed with: CRNA, Anesthesiologist and Surgeon  Anesthesia Plan Comments:        Anesthesia Quick Evaluation

## 2017-04-21 NOTE — Op Note (Signed)
Operative Note  Danielle [name]  [id number]  [id number]  04/21/2017   Surgeon: Leeroy Bockhelsea A ConnorMD  Assistant: OR staff  Procedure performed: Excision of subcutaneous cysts: 1x2cm right upper inner thigh; 2x3cm right perineum, 1x2cm left labia  Preop diagnosis: multiple subcutaneous cysts chronically draining/infected Post-op diagnosis/intraop findings: same  Specimens: right thigh cyst, right perineum cyst, left labia cyst Retained items: no EBL: 10cc Complications: none  Description of procedure: After obtaining informed consent the patient was taken to the operating room and placed supine on operating room table wheregeneral  anesthesia was initiated, preoperative antibiotics were administered, SCDs applied, and a formal timeout was performed. She was placed in dorsal lithotomy position and the perineum was prepped and draped with Betadine. The preop holding area, the patient had identified 3 cysts, one in the right upper thigh/groin crease, 1 in the right perineum/buttock, and one in the left labia which she desired excised. Of note these were a different set than those that I had identified our first meeting back in April. She also noted a ossiblesmall nodule in the left perineum but on exam under anesthesia did not appreciate any cyst or nodule present at this time. Beginning with the left labial cyst, the surrounding soft tissue was infiltrated with quarter percent plain Marcaine and an elliptical incision was made longitudinally to include the central area of chronic drainage. Using electrocautery the soft tissues were dissected to excise the cyst in its entirety down to healthy fatty tissue. Hemostasis was ensured within the wound using cautery. I then turned to the right upper thigh cyst in a similar fashion localization infiltrated, an elliptical incision created sharply, and the soft tissues dissected using cautery such that the cyst was excised entirely and healthy fatty  tissue was exposed at the base.Hemostasis was again achieved with cautery. Then turned to the largest of the 3 cysts which was to the right of midline in the perineum/buttock. The soft tissues were infiltrated with local, an elliptical incision was created in the soft tissues dissected using cautery to excise the cyst. The cyst was entered partially at the medial aspect of the dissection and chronically inflamed granulating tissue was encountered. This was excised to be included within the specimen Once again hemostasis was ensured with cautery. All 3 wounds were then irrigated and then closed with deep dermal 3-0 Vicryl interrupted sutures. I again inspected the left perineum where the patient had expressed concerns of pain in the nodule, there was a small area of ulcerated skin but no discrete nodule or mass here. The procedure was then completed. All the wounds were clean and dry dressings were applied. The patient was then return to the supine position, awakened extubated and taken to the recovery area in stable condition.  All counts were correct at the completion of the case.

## 2017-04-21 NOTE — H&P (Signed)
Surgical H&P  CC: perineal boils  HPI: Nice woman who has been having issues with "boils" in her perineum since about September last yr. No procedures, but has been on multiple abx. Had a bartholins abscess I&Dd many years ago.  The lesions she has now will swell and then go down, have drained on occasion; the one on her labia has started draining presently. Currently not on abx. No systemic symptoms. Otherwise relatively healthy.       Allergies  Allergen Reactions  . Septra [Bactrim] Nausea And Vomiting        Past Medical History:  Diagnosis Date  . Bacterial vaginosis    RECURRENT  . HSV (herpes simplex virus) anogenital infection   . Migraines          Past Surgical History:  Procedure Laterality Date  . CESAREAN SECTION    . INCISE AND DRAIN ABCESS  [id number]   LEFT LABIAL-SKENE   . TUBAL LIGATION           Family History  Problem Relation Age of Onset  . Diabetes Mother   . Hypertension Mother   . Diabetes Father     Social History   Social History  . Marital status: Married    Spouse name: N/A  . Number of children: N/A  . Years of education: N/A         Social History Main Topics  . Smoking status: Never Smoker  . Smokeless tobacco: Never Used  . Alcohol use No  . Drug use: No  . Sexual activity: Yes    Birth control/ protection: Surgical     Comment: TUBAL LIGATION       Other Topics Concern  . Not on file      Social History Narrative  . No narrative on file          Current Outpatient Prescriptions on File Prior to Visit  Medication Sig Dispense Refill  . ampicillin (PRINCIPEN) 500 MG capsule Take 1 capsule (500 mg total) by mouth QID. 20 capsule 0  . Butalbital-APAP-Caffeine 50-300-40 MG CAPS Take 1 tablet by mouth daily as needed (FOR MIGRAINES).   0  . clindamycin (CLEOCIN) 2 % vaginal cream Place 1 Applicatorful vaginally at bedtime. 40 g 0  . doxycycline (VIBRAMYCIN) 100 MG capsule Take 1  capsule (100 mg total) by mouth 2 (two) times daily. 20 capsule 0  . fluconazole (DIFLUCAN) 150 MG tablet Take 1 tablet (150 mg total) by mouth daily. 3 tablet 0  . RaNITidine HCl (ZANTAC PO) Take 1 tablet by mouth daily as needed (for heartburn).    . SUMAtriptan (IMITREX) 50 MG tablet Take 1 tablet (50 mg total) by mouth every 2 (two) hours as needed for migraine. May repeat in 2 hours if headache persists or recurs, no more than 2 tablets in 24 hours. 10 tablet 3  . valACYclovir (VALTREX) 500 MG tablet Take twice daily 180 tablet 4   No current facility-administered medications on file prior to visit.     Review of Systems: a complete, 10pt review of systems was completed with pertinent positives and negatives as documented in the HPI.   Physical Exam: There were no vitals filed for this visit. Gen: A&Ox3, no distress  Head: normocephalic, atraumatic, EOMI, anicteric.  Neck: supple without mass or thyromegaly Chest: unlabored respirations symmetrical air entry  Cardiovascular: RRR with palpable distal pulses Abdomen: soft, nontender, nondistended, no mass or organomegaly Extremities: warm, without edema, no deformities  Neuro: grossly  intact, normal gait Psych: appropriate mood and affect appropriate insight Skin: There is a 3cm cyst in the right upper inner thigh, a 1cm cyst on the left posterior labia, and a 1cm cyst on the left upper inner thigh. Mildly tender, no drainage or erythema currently  CBC Latest Ref Rng & Units 09/21/2015 04/30/2014 01/28/2013  WBC 4.0 - 10.5 K/uL 9.1 10.4 8.8  Hemoglobin 12.0 - 15.0 g/dL 30.813.2 65.712.7 84.612.9  Hematocrit 36.0 - 46.0 % 39.3 38.3 38.1  Platelets 150 - 400 K/uL 338 393 326    CMP Latest Ref Rng & Units 09/21/2015 04/30/2014 01/28/2013  Glucose 65 - 99 mg/dL 962(X120(H) 90 91  BUN 6 - 20 mg/dL 6 9 -  Creatinine 5.280.44 - 1.00 mg/dL 4.130.78 2.440.75 -  Sodium 010135 - 145 mmol/L 142 139 -  Potassium 3.5 - 5.1 mmol/L 3.7 4.0 -  Chloride 101 - 111 mmol/L  103 102 -  CO2 22 - 32 mmol/L 26 27 -  Calcium 8.9 - 10.3 mg/dL 9.6 9.1 -  Total Protein 6.0 - 8.3 g/dL - 7.2 -  Total Bilirubin 0.2 - 1.2 mg/dL - 0.3 -  Alkaline Phos 39 - 117 U/L - 67 -  AST 0 - 37 U/L - 12 -  ALT 0 - 35 U/L - 11 -    RecentLabs  No results found for: INR, PROTIME    Imaging: n/a  A/P: 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 Will plan excision in OR   Phylliss Blakeshelsea Kaidon Kinker, MD [facility], [place] Pager [phone]

## 2017-04-21 NOTE — Transfer of Care (Signed)
Immediate Anesthesia Transfer of Care Note  Patient: Danielle [name]  Procedure(s) Performed: EXCISION OF SEBACEOUS CYST OF BILATERAL THIGHS AND LEFT LABIA (N/A Perineum)  Patient Location: PACU  Anesthesia Type:General  Level of Consciousness: awake and sedated  Airway & Oxygen Therapy: Patient Spontanous Breathing and Patient connected to face mask oxygen  Post-op Assessment: Report given to RN and Post -op Vital signs reviewed and stable  Post vital signs: Reviewed and stable  Last Vitals:  Vitals:   04/21/17 0707  BP: (!) 145/77  Pulse: 84  Temp: 36.7 C  SpO2: 100%    Last Pain:  Vitals:   04/21/17 0707  TempSrc: Oral         Complications: No apparent anesthesia complications

## 2017-04-21 NOTE — Anesthesia Procedure Notes (Signed)
Procedure Name: LMA Insertion Performed by: Khian Remo W Pre-anesthesia Checklist: Patient identified, Emergency Drugs available, Suction available and Patient being monitored Patient Re-evaluated:Patient Re-evaluated prior to induction Oxygen Delivery Method: Circle system utilized Preoxygenation: Pre-oxygenation with 100% oxygen Induction Type: IV induction Ventilation: Mask ventilation without difficulty LMA: LMA inserted LMA Size: 4.0 Number of attempts: 1 Placement Confirmation: positive ETCO2 Tube secured with: Tape Dental Injury: Teeth and Oropharynx as per pre-operative assessment        

## 2017-04-24 ENCOUNTER — Encounter (HOSPITAL_BASED_OUTPATIENT_CLINIC_OR_DEPARTMENT_OTHER): Payer: Self-pay | Admitting: Surgery

## 2017-07-04 DIAGNOSIS — R7303 Prediabetes: Secondary | ICD-10-CM

## 2017-07-04 HISTORY — DX: Prediabetes: R73.03

## 2018-03-07 ENCOUNTER — Ambulatory Visit: Payer: 59 | Admitting: Women's Health

## 2018-03-29 ENCOUNTER — Other Ambulatory Visit: Payer: Self-pay | Admitting: Women's Health

## 2018-03-29 DIAGNOSIS — N6310 Unspecified lump in the right breast, unspecified quadrant: Secondary | ICD-10-CM

## 2018-04-04 ENCOUNTER — Ambulatory Visit: Payer: Managed Care, Other (non HMO)

## 2018-04-04 ENCOUNTER — Ambulatory Visit
Admission: RE | Admit: 2018-04-04 | Discharge: 2018-04-04 | Disposition: A | Discharge status: Home / Self Care | Payer: 59 | Source: Ambulatory Visit | Attending: Women's Health | Admitting: Women's Health

## 2018-04-04 DIAGNOSIS — N6310 Unspecified lump in the right breast, unspecified quadrant: Secondary | ICD-10-CM

## 2018-04-17 ENCOUNTER — Encounter: Payer: Self-pay | Admitting: Women's Health

## 2018-04-17 ENCOUNTER — Ambulatory Visit (INDEPENDENT_AMBULATORY_CARE_PROVIDER_SITE_OTHER): Payer: 59 | Admitting: Women's Health

## 2018-04-17 VITALS — BP 118/90 | HR 82 | Resp 16 | Ht 65.0 in | Wt 177.2 lb

## 2018-04-17 DIAGNOSIS — Z8639 Personal history of other endocrine, nutritional and metabolic disease: Secondary | ICD-10-CM | POA: Diagnosis not present

## 2018-04-17 DIAGNOSIS — Z01419 Encounter for gynecological examination (general) (routine) without abnormal findings: Secondary | ICD-10-CM

## 2018-04-17 DIAGNOSIS — A609 Anogenital herpesviral infection, unspecified: Secondary | ICD-10-CM | POA: Diagnosis not present

## 2018-04-17 MED ORDER — VALACYCLOVIR HCL 500 MG PO TABS: ORAL_TABLET | ORAL | 4 refills | Status: DC

## 2018-04-17 NOTE — Progress Notes (Signed)
MARVELINE PROFETA 09-Jul-1970 [id number]    History:    Presents for annual exam with no complaints.  Monthly cycle/BTL.  Normal pap and mammogram history.  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  Normal health screen labs at work.   Past medical history, past surgical history, family history and social history were all reviewed and documented in the EPIC chart. 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   Parents diabetes and hypertension.   ROS:  A ROS was performed and pertinent positives and negatives are included.  Exam:  Vitals:   04/17/18 1537  BP: 118/90  Pulse: 82  Resp: 16  Weight: 177 lb 3.2 oz (80.4 kg)  Height: 5\' 5"  (1.651 m)   Body mass index is 29.49 kg/m.   General appearance:  Normal Thyroid:  Symmetrical, normal in size, without palpable masses or nodularity. Respiratory  Auscultation:  Clear without wheezing or rhonchi Cardiovascular  Auscultation:  Regular rate, without rubs, murmurs or gallops  Edema/varicosities:  Not grossly evident Abdominal  Soft,nontender, without masses, guarding or rebound.  Liver/spleen:  No organomegaly noted  Hernia:  None appreciated  Skin  Inspection:  Grossly normal   Breasts: Examined lying and sitting.     Right: Without masses, retractions, discharge or axillary adenopathy.     Left: Without masses, retractions, discharge or axillary adenopathy. Gentitourinary   Inguinal/mons:  Normal without inguinal adenopathy  External genitalia:  Normal  BUS/Urethra/Skene's glands:  Normal  Vagina:  Normal  Cervix:  Normal  Uterus:  Normal in size, shape and contour.  Midline and mobile  Adnexa/parametria:     Rt: Without masses or tenderness.   Lt: Without masses or tenderness.  Anus and perineum: Normal  Digital rectal exam: Normal sphincter tone without palpated masses or tenderness  Assessment/Plan:  47 y.o.  MBF G6 P3 for annual exam with no  complaints.    Monthly cycle/BTL Migraines without aura Overweight HSV  Plan: SBE's, continue annual mammogram.  Valtrex  500 twice daily for 3 to 5 days as needed prescription, proper use given and reviewed.  Encouraged exercise, calcium rich diet, limit simple carbs and sugar, and vitamin D 1000 daily encouraged. Pack lunches and limit soft drinks. Encouraged drinking water. CBC, CMP, A1C blood work was collected.  PAP HR HPV typing.  New screening guidelines reviewed.     Harrington Challenger [facility], 4:51 PM 04/17/2018

## 2018-04-17 NOTE — Patient Instructions (Signed)
Health Maintenance, Female Adopting a healthy lifestyle and getting preventive care can go a long way to promote health and wellness. Talk with your health care provider about what schedule of regular examinations is right for you. This is a good chance for you to check in with your provider about disease prevention and staying healthy. In between checkups, there are plenty of things you can do on your own. Experts have done a lot of research about which lifestyle changes and preventive measures are most likely to keep you healthy. Ask your health care provider for more information. Weight and diet Eat a healthy diet  Be sure to include plenty of vegetables, fruits, low-fat dairy products, and lean protein.  Do not eat a lot of foods high in solid fats, added sugars, or salt.  Get regular exercise. This is one of the most important things you can do for your health. ? Most adults should exercise for at least 150 minutes each week. The exercise should increase your heart rate and make you sweat (moderate-intensity exercise). ? Most adults should also do strengthening exercises at least twice a week. This is in addition to the moderate-intensity exercise.  Maintain a healthy weight  Body mass index (BMI) is a measurement that can be used to identify possible weight problems. It estimates body fat based on height and weight. Your health care provider can help determine your BMI and help you achieve or maintain a healthy weight.  For females 11 years of age and older: ? A BMI below 18.5 is considered underweight. ? A BMI of 18.5 to 24.9 is normal. ? A BMI of 25 to 29.9 is considered overweight. ? A BMI of 30 and above is considered obese.  Watch levels of cholesterol and blood lipids  You should start having your blood tested for lipids and cholesterol at 45 years of age, then have this test every 5 years.  You may need to have your cholesterol levels checked more often if: ? Your lipid or  cholesterol levels are high. ? You are older than 45 years of age. ? You are at high risk for heart disease.  Cancer screening Lung Cancer  Lung cancer screening is recommended for adults 58-61 years old who are at high risk for lung cancer because of a history of smoking.  A yearly low-dose CT scan of the lungs is recommended for people who: ? Currently smoke. ? Have quit within the past 15 years. ? Have at least a 30-pack-year history of smoking. A pack year is smoking an average of one pack of cigarettes a day for 1 year.  Yearly screening should continue until it has been 15 years since you quit.  Yearly screening should stop if you develop a health problem that would prevent you from having lung cancer treatment.  Breast Cancer  Practice breast self-awareness. This means understanding how your breasts normally appear and feel.  It also means doing regular breast self-exams. Let your health care provider know about any changes, no matter how small.  If you are in your 20s or 30s, you should have a clinical breast exam (CBE) by a health care provider every 1-3 years as part of a regular health exam.  If you are 73 or older, have a CBE every year. Also consider having a breast X-ray (mammogram) every year.  If you have a family history of breast cancer, talk to your health care provider about genetic screening.  If you are at high risk  for breast cancer, talk to your health care provider about having an MRI and a mammogram every year.  Breast cancer gene (BRCA) assessment is recommended for women who have family members with BRCA-related cancers. BRCA-related cancers include: ? Breast. ? Ovarian. ? Tubal. ? Peritoneal cancers.  Results of the assessment will determine the need for genetic counseling and BRCA1 and BRCA2 testing.  Cervical Cancer Your health care provider may recommend that you be screened regularly for cancer of the pelvic organs (ovaries, uterus, and  vagina). This screening involves a pelvic examination, including checking for microscopic changes to the surface of your cervix (Pap test). You may be encouraged to have this screening done every 3 years, beginning at age 22.  For women ages 56-65, health care providers may recommend pelvic exams and Pap testing every 3 years, or they may recommend the Pap and pelvic exam, combined with testing for human papilloma virus (HPV), every 5 years. Some types of HPV increase your risk of cervical cancer. Testing for HPV may also be done on women of any age with unclear Pap test results.  Other health care providers may not recommend any screening for nonpregnant women who are considered low risk for pelvic cancer and who do not have symptoms. Ask your health care provider if a screening pelvic exam is right for you.  If you have had past treatment for cervical cancer or a condition that could lead to cancer, you need Pap tests and screening for cancer for at least 20 years after your treatment. If Pap tests have been discontinued, your risk factors (such as having a new sexual partner) need to be reassessed to determine if screening should resume. Some women have medical problems that increase the chance of getting cervical cancer. In these cases, your health care provider may recommend more frequent screening and Pap tests.  Colorectal Cancer  This type of cancer can be detected and often prevented.  Routine colorectal cancer screening usually begins at 45 years of age and continues through 45 years of age.  Your health care provider may recommend screening at an earlier age if you have risk factors for colon cancer.  Your health care provider may also recommend using home test kits to check for hidden blood in the stool.  A small camera at the end of a tube can be used to examine your colon directly (sigmoidoscopy or colonoscopy). This is done to check for the earliest forms of colorectal  cancer.  Routine screening usually begins at age 33.  Direct examination of the colon should be repeated every 5-10 years through 45 years of age. However, you may need to be screened more often if early forms of precancerous polyps or small growths are found.  Skin Cancer  Check your skin from head to toe regularly.  Tell your health care provider about any new moles or changes in moles, especially if there is a change in a mole's shape or color.  Also tell your health care provider if you have a mole that is larger than the size of a pencil eraser.  Always use sunscreen. Apply sunscreen liberally and repeatedly throughout the day.  Protect yourself by wearing long sleeves, pants, a wide-brimmed hat, and sunglasses whenever you are outside.  Heart disease, diabetes, and high blood pressure  High blood pressure causes heart disease and increases the risk of stroke. High blood pressure is more likely to develop in: ? People who have blood pressure in the high end of  the normal range (130-139/85-89 mm Hg). ? People who are overweight or obese. ? People who are African American.  If you are 21-29 years of age, have your blood pressure checked every 3-5 years. If you are 3 years of age or older, have your blood pressure checked every year. You should have your blood pressure measured twice-once when you are at a hospital or clinic, and once when you are not at a hospital or clinic. Record the average of the two measurements. To check your blood pressure when you are not at a hospital or clinic, you can use: ? An automated blood pressure machine at a pharmacy. ? A home blood pressure monitor.  If you are between 17 years and 37 years old, ask your health care provider if you should take aspirin to prevent strokes.  Have regular diabetes screenings. This involves taking a blood sample to check your fasting blood sugar level. ? If you are at a normal weight and have a low risk for diabetes,  have this test once every three years after 45 years of age. ? If you are overweight and have a high risk for diabetes, consider being tested at a younger age or more often. Preventing infection Hepatitis B  If you have a higher risk for hepatitis B, you should be screened for this virus. You are considered at high risk for hepatitis B if: ? You were born in a country where hepatitis B is common. Ask your health care provider which countries are considered high risk. ? Your parents were born in a high-risk country, and you have not been immunized against hepatitis B (hepatitis B vaccine). ? You have HIV or AIDS. ? You use needles to inject street drugs. ? You live with someone who has hepatitis B. ? You have had sex with someone who has hepatitis B. ? You get hemodialysis treatment. ? You take certain medicines for conditions, including cancer, organ transplantation, and autoimmune conditions.  Hepatitis C  Blood testing is recommended for: ? Everyone born from [birth year] through 1963. ? Anyone with known risk factors for hepatitis C.  Sexually transmitted infections (STIs)  You should be screened for sexually transmitted infections (STIs) including gonorrhea and chlamydia if: ? You are sexually active and are younger than 45 years of age. ? You are older than 45 years of age and your health care provider tells you that you are at risk for this type of infection. ? Your sexual activity has changed since you were last screened and you are at an increased risk for chlamydia or gonorrhea. Ask your health care provider if you are at risk.  If you do not have HIV, but are at risk, it may be recommended that you take a prescription medicine daily to prevent HIV infection. This is called pre-exposure prophylaxis (PrEP). You are considered at risk if: ? You are sexually active and do not regularly use condoms or know the HIV status of your partner(s). ? You take drugs by injection. ? You are  sexually active with a partner who has HIV.  Talk with your health care provider about whether you are at high risk of being infected with HIV. If you choose to begin PrEP, you should first be tested for HIV. You should then be tested every 3 months for as long as you are taking PrEP. Pregnancy  If you are premenopausal and you may become pregnant, ask your health care provider about preconception counseling.  If you may become  pregnant, take 400 to 800 micrograms (mcg) of folic acid every day.  If you want to prevent pregnancy, talk to your health care provider about birth control (contraception). Osteoporosis and menopause  Osteoporosis is a disease in which the bones lose minerals and strength with aging. This can result in serious bone fractures. Your risk for osteoporosis can be identified using a bone density scan.  If you are 76 years of age or older, or if you are at risk for osteoporosis and fractures, ask your health care provider if you should be screened.  Ask your health care provider whether you should take a calcium or vitamin D supplement to lower your risk for osteoporosis.  Menopause may have certain physical symptoms and risks.  Hormone replacement therapy may reduce some of these symptoms and risks. Talk to your health care provider about whether hormone replacement therapy is right for you. Follow these instructions at home:  Schedule regular health, dental, and eye exams.  Stay current with your immunizations.  Do not use any tobacco products including cigarettes, chewing tobacco, or electronic cigarettes.  If you are pregnant, do not drink alcohol.  If you are breastfeeding, limit how much and how often you drink alcohol.  Limit alcohol intake to no more than 1 drink per day for nonpregnant women. One drink equals 12 ounces of beer, 5 ounces of wine, or 1 ounces of hard liquor.  Do not use street drugs.  Do not share needles.  Ask your health care  provider for help if you need support or information about quitting drugs.  Tell your health care provider if you often feel depressed.  Tell your health care provider if you have ever been abused or do not feel safe at home. This information is not intended to replace advice given to you by your health care provider. Make sure you discuss any questions you have with your health care provider. Document Released: 01/03/2011 Document Revised: 11/26/2015 Document Reviewed: 03/24/2015 Elsevier Interactive Patient Education  2018 [employer]. Carbohydrate Counting for Diabetes Mellitus, Adult Carbohydrate counting is a method for keeping track of how many carbohydrates you eat. Eating carbohydrates naturally increases the amount of sugar (glucose) in the blood. Counting how many carbohydrates you eat helps keep your blood glucose within normal limits, which helps you manage your diabetes (diabetes mellitus). It is important to know how many carbohydrates you can safely have in each meal. This is different for every person. A diet and nutrition specialist (registered dietitian) can help you make a meal plan and calculate how many carbohydrates you should have at each meal and snack. Carbohydrates are found in the following foods:  Grains, such as breads and cereals.  Dried beans and soy products.  Starchy vegetables, such as potatoes, peas, and corn.  Fruit and fruit juices.  Milk and yogurt.  Sweets and snack foods, such as cake, cookies, candy, chips, and soft drinks.  How do I count carbohydrates? There are two ways to count carbohydrates in food. You can use either of the methods or a combination of both. Reading "Nutrition Facts" on packaged food The "Nutrition Facts" list is included on the labels of almost all packaged foods and beverages in the [place] It includes:  The serving size.  Information about nutrients in each serving, including the grams (g) of carbohydrate per  serving.  To use the "Nutrition Facts":  Decide how many servings you will have.  Multiply the number of servings by the number of carbohydrates  per serving.  The resulting number is the total amount of carbohydrates that you will be having.  Learning standard serving sizes of other foods When you eat foods containing carbohydrates that are not packaged or do not include "Nutrition Facts" on the label, you need to measure the servings in order to count the amount of carbohydrates:  Measure the foods that you will eat with a food scale or measuring cup, if needed.  Decide how many standard-size servings you will eat.  Multiply the number of servings by 15. Most carbohydrate-rich foods have about 15 g of carbohydrates per serving. ? For example, if you eat 8 oz (170 g) of strawberries, you will have eaten 2 servings and 30 g of carbohydrates (2 servings x 15 g = 30 g).  For foods that have more than one food mixed, such as soups and casseroles, you must count the carbohydrates in each food that is included.  The following list contains standard serving sizes of common carbohydrate-rich foods. Each of these servings has about 15 g of carbohydrates:   hamburger bun or  English muffin.   oz (15 mL) syrup.   oz (14 g) jelly.  1 slice of bread.  1 six-inch tortilla.  3 oz (85 g) cooked rice or pasta.  4 oz (113 g) cooked dried beans.  4 oz (113 g) starchy vegetable, such as peas, corn, or potatoes.  4 oz (113 g) hot cereal.  4 oz (113 g) mashed potatoes or  of a large baked potato.  4 oz (113 g) canned or frozen fruit.  4 oz (120 mL) fruit juice.  4-6 crackers.  6 chicken nuggets.  6 oz (170 g) unsweetened dry cereal.  6 oz (170 g) plain fat-free yogurt or yogurt sweetened with artificial sweeteners.  8 oz (240 mL) milk.  8 oz (170 g) fresh fruit or one small piece of fruit.  24 oz (680 g) popped popcorn.  Example of carbohydrate counting Sample meal  3  oz (85 g) chicken breast.  6 oz (170 g) brown rice.  4 oz (113 g) corn.  8 oz (240 mL) milk.  8 oz (170 g) strawberries with sugar-free whipped topping. Carbohydrate calculation 1. Identify the foods that contain carbohydrates: ? Rice. ? Corn. ? Milk. ? Strawberries. 2. Calculate how many servings you have of each food: ? 2 servings rice. ? 1 serving corn. ? 1 serving milk. ? 1 serving strawberries. 3. Multiply each number of servings by 15 g: ? 2 servings rice x 15 g = 30 g. ? 1 serving corn x 15 g = 15 g. ? 1 serving milk x 15 g = 15 g. ? 1 serving strawberries x 15 g = 15 g. 4. Add together all of the amounts to find the total grams of carbohydrates eaten: ? 30 g + 15 g + 15 g + 15 g = 75 g of carbohydrates total. This information is not intended to replace advice given to you by your health care provider. Make sure you discuss any questions you have with your health care provider. Document Released: 06/20/2005 Document Revised: 01/08/2016 Document Reviewed: 12/02/2015 Elsevier Interactive Patient Education  [employer].

## 2018-04-18 ENCOUNTER — Other Ambulatory Visit: Payer: Self-pay | Admitting: Women's Health

## 2018-04-18 DIAGNOSIS — R7309 Other abnormal glucose: Secondary | ICD-10-CM

## 2018-04-18 LAB — COMPREHENSIVE METABOLIC PANEL
AG Ratio: 1.6 (calc) (ref 1.0–2.5)
ALBUMIN MSPROF: 4.3 g/dL (ref 3.6–5.1)
ALKALINE PHOSPHATASE (APISO): 79 U/L (ref 33–115)
ALT: 13 U/L (ref 6–29)
AST: 14 U/L (ref 10–30)
BILIRUBIN TOTAL: 0.3 mg/dL (ref 0.2–1.2)
BUN: 10 mg/dL (ref 7–25)
CALCIUM: 9 mg/dL (ref 8.6–10.2)
CO2: 28 mmol/L (ref 20–32)
CREATININE: 0.79 mg/dL (ref 0.50–1.10)
Chloride: 102 mmol/L (ref 98–110)
Globulin: 2.7 g/dL (calc) (ref 1.9–3.7)
Glucose, Bld: 96 mg/dL (ref 65–99)
Potassium: 3.7 mmol/L (ref 3.5–5.3)
Sodium: 138 mmol/L (ref 135–146)
Total Protein: 7 g/dL (ref 6.1–8.1)

## 2018-04-18 LAB — CBC WITH DIFFERENTIAL/PLATELET
BASOS ABS: 30 {cells}/uL (ref 0–200)
BASOS PCT: 0.5 %
EOS ABS: 100 {cells}/uL (ref 15–500)
Eosinophils Relative: 1.7 %
HEMATOCRIT: 36.2 % (ref 35.0–45.0)
Hemoglobin: 12.4 g/dL (ref 11.7–15.5)
Lymphs Abs: 1800 cells/uL (ref 850–3900)
MCH: 28.7 pg (ref 27.0–33.0)
MCHC: 34.3 g/dL (ref 32.0–36.0)
MCV: 83.8 fL (ref 80.0–100.0)
MPV: 11 fL (ref 7.5–12.5)
Monocytes Relative: 9.5 %
Neutro Abs: 3410 cells/uL (ref 1500–7800)
Neutrophils Relative %: 57.8 %
PLATELETS: 339 10*3/uL (ref 140–400)
RBC: 4.32 10*6/uL (ref 3.80–5.10)
RDW: 12.5 % (ref 11.0–15.0)
TOTAL LYMPHOCYTE: 30.5 %
WBC: 5.9 10*3/uL (ref 3.8–10.8)
WBCMIX: 561 {cells}/uL (ref 200–950)

## 2018-04-18 LAB — HEMOGLOBIN A1C
EAG (MMOL/L): 7.3 (calc)
HEMOGLOBIN A1C: 6.2 %{Hb} — AB (ref <5.7)
Mean Plasma Glucose: 131 (calc)

## 2018-04-19 LAB — PAP, TP IMAGING W/ HPV RNA, RFLX HPV TYPE 16,18/45: HPV DNA High Risk: NOT DETECTED

## 2018-04-25 ENCOUNTER — Telehealth: Payer: Self-pay | Admitting: *Deleted

## 2018-04-25 NOTE — Telephone Encounter (Signed)
Okay for Diflucan 1501 dose office visit if no relief 

## 2018-04-25 NOTE — Telephone Encounter (Signed)
Patient called requesting diflucan tablet, c/o yeast infection, itching. Please advise

## 2018-04-26 MED ORDER — FLUCONAZOLE 150 MG PO TABS: 150.0000 mg | ORAL_TABLET | Freq: Once | ORAL | 0 refills | Status: DC

## 2018-04-26 NOTE — Telephone Encounter (Signed)
Rx sent 

## 2018-08-27 ENCOUNTER — Other Ambulatory Visit: Payer: Self-pay | Admitting: Women's Health

## 2018-08-27 NOTE — Telephone Encounter (Signed)
Okay for refill office visit if no relief

## 2019-03-26 ENCOUNTER — Encounter: Payer: Self-pay | Admitting: Gynecology

## 2019-04-02 ENCOUNTER — Other Ambulatory Visit: Payer: Self-pay | Admitting: Family Medicine

## 2019-04-02 DIAGNOSIS — R1011 Right upper quadrant pain: Secondary | ICD-10-CM

## 2019-04-04 ENCOUNTER — Ambulatory Visit
Admission: RE | Admit: 2019-04-04 | Discharge: 2019-04-04 | Disposition: A | Discharge status: Home / Self Care | Payer: 59 | Source: Ambulatory Visit | Attending: Family Medicine | Admitting: Family Medicine

## 2019-04-04 ENCOUNTER — Encounter (HOSPITAL_COMMUNITY): Payer: Self-pay

## 2019-04-04 ENCOUNTER — Observation Stay (HOSPITAL_COMMUNITY)
Admission: EM | Admit: 2019-04-04 | Discharge: 2019-04-07 | Disposition: A | Discharge status: Home / Self Care | Payer: No Typology Code available for payment source | Attending: Surgery | Admitting: Surgery

## 2019-04-04 ENCOUNTER — Other Ambulatory Visit: Payer: Self-pay

## 2019-04-04 DIAGNOSIS — Z882 Allergy status to sulfonamides status: Secondary | ICD-10-CM | POA: Insufficient documentation

## 2019-04-04 DIAGNOSIS — Z833 Family history of diabetes mellitus: Secondary | ICD-10-CM | POA: Insufficient documentation

## 2019-04-04 DIAGNOSIS — K819 Cholecystitis, unspecified: Secondary | ICD-10-CM | POA: Diagnosis present

## 2019-04-04 DIAGNOSIS — G43909 Migraine, unspecified, not intractable, without status migrainosus: Secondary | ICD-10-CM | POA: Insufficient documentation

## 2019-04-04 DIAGNOSIS — R1011 Right upper quadrant pain: Secondary | ICD-10-CM

## 2019-04-04 DIAGNOSIS — K810 Acute cholecystitis: Secondary | ICD-10-CM | POA: Diagnosis present

## 2019-04-04 DIAGNOSIS — K8012 Calculus of gallbladder with acute and chronic cholecystitis without obstruction: Principal | ICD-10-CM | POA: Insufficient documentation

## 2019-04-04 DIAGNOSIS — Z881 Allergy status to other antibiotic agents status: Secondary | ICD-10-CM | POA: Diagnosis not present

## 2019-04-04 DIAGNOSIS — Z20828 Contact with and (suspected) exposure to other viral communicable diseases: Secondary | ICD-10-CM | POA: Diagnosis not present

## 2019-04-04 DIAGNOSIS — Z8249 Family history of ischemic heart disease and other diseases of the circulatory system: Secondary | ICD-10-CM | POA: Diagnosis not present

## 2019-04-04 DIAGNOSIS — K828 Other specified diseases of gallbladder: Secondary | ICD-10-CM | POA: Diagnosis not present

## 2019-04-04 LAB — URINALYSIS, ROUTINE W REFLEX MICROSCOPIC
Bilirubin Urine: NEGATIVE
Glucose, UA: NEGATIVE mg/dL
Ketones, ur: NEGATIVE mg/dL
Nitrite: NEGATIVE
Protein, ur: NEGATIVE mg/dL
RBC / HPF: >50 RBC/hpf — ABNORMAL HIGH (ref 0–5)
Specific Gravity, Urine: 1.019 (ref 1.005–1.030)
pH: 5 (ref 5.0–8.0)

## 2019-04-04 LAB — COMPREHENSIVE METABOLIC PANEL
ALT: 11 U/L (ref 0–44)
AST: 12 U/L — ABNORMAL LOW (ref 15–41)
Albumin: 4 g/dL (ref 3.5–5.0)
Alkaline Phosphatase: 68 U/L (ref 38–126)
Anion gap: 8 (ref 5–15)
BUN: 7 mg/dL (ref 6–20)
CO2: 24 mmol/L (ref 22–32)
Calcium: 9.1 mg/dL (ref 8.9–10.3)
Chloride: 107 mmol/L (ref 98–111)
Creatinine, Ser: 0.82 mg/dL (ref 0.44–1.00)
GFR calc Af Amer: >60 mL/min (ref >60)
GFR calc non Af Amer: >60 mL/min (ref >60)
Glucose, Bld: 119 mg/dL — ABNORMAL HIGH (ref 70–99)
Potassium: 3.3 mmol/L — ABNORMAL LOW (ref 3.5–5.1)
Sodium: 139 mmol/L (ref 135–145)
Total Bilirubin: 0.3 mg/dL (ref 0.3–1.2)
Total Protein: 7.3 g/dL (ref 6.5–8.1)

## 2019-04-04 LAB — CBC
HCT: 39.6 % (ref 36.0–46.0)
Hemoglobin: 12.5 g/dL (ref 12.0–15.0)
MCH: 28.9 pg (ref 26.0–34.0)
MCHC: 31.6 g/dL (ref 30.0–36.0)
MCV: 91.7 fL (ref 80.0–100.0)
Platelets: 364 10*3/uL (ref 150–400)
RBC: 4.32 MIL/uL (ref 3.87–5.11)
RDW: 12.4 % (ref 11.5–15.5)
WBC: 9.3 10*3/uL (ref 4.0–10.5)
nRBC: 0 % (ref 0.0–0.2)

## 2019-04-04 LAB — I-STAT BETA HCG BLOOD, ED (MC, WL, AP ONLY): I-stat hCG, quantitative: <5 m[IU]/mL (ref <5)

## 2019-04-04 LAB — LIPASE, BLOOD: Lipase: 29 U/L (ref 11–51)

## 2019-04-04 MED ORDER — SODIUM CHLORIDE 0.9% FLUSH
3.0000 mL | Freq: Once | INTRAVENOUS | Status: AC
  Administered 2019-04-05: 3 mL via INTRAVENOUS

## 2019-04-04 NOTE — ED Triage Notes (Signed)
Pt reports sharp, cramping right sided abd pain for the past 3 weeks. Denies n/v. Pt had outpatient US done that showed "acute cholecystitis with a gallstone in the neck of the gallbladder at the junction with cystic duct". Pt a.o, nad noted.

## 2019-04-05 DIAGNOSIS — K819 Cholecystitis, unspecified: Secondary | ICD-10-CM | POA: Diagnosis present

## 2019-04-05 LAB — SURGICAL PCR SCREEN
MRSA, PCR: NEGATIVE
Staphylococcus aureus: NEGATIVE

## 2019-04-05 LAB — SARS CORONAVIRUS 2 (TAT 6-24 HRS): SARS Coronavirus 2: NEGATIVE

## 2019-04-05 MED ORDER — ACETAMINOPHEN 650 MG RE SUPP: 650.0000 mg | Freq: Four times a day (QID) | RECTAL | Status: DC | PRN

## 2019-04-05 MED ORDER — ACETAMINOPHEN 325 MG PO TABS
650.0000 mg | ORAL_TABLET | Freq: Four times a day (QID) | ORAL | Status: DC | PRN
  Administered 2019-04-05: 21:00:00 650 mg via ORAL
  Filled 2019-04-05: qty 2

## 2019-04-05 MED ORDER — KETOROLAC TROMETHAMINE 15 MG/ML IJ SOLN
15.0000 mg | INTRAMUSCULAR | Status: AC
  Filled 2019-04-05 (×2): qty 1

## 2019-04-05 MED ORDER — ONDANSETRON 4 MG PO TBDP: 4.0000 mg | ORAL_TABLET | Freq: Four times a day (QID) | ORAL | Status: DC | PRN

## 2019-04-05 MED ORDER — GABAPENTIN 100 MG PO CAPS
100.0000 mg | ORAL_CAPSULE | ORAL | Status: AC
  Filled 2019-04-05 (×2): qty 1

## 2019-04-05 MED ORDER — ACETAMINOPHEN 500 MG PO TABS
1000.0000 mg | ORAL_TABLET | ORAL | Status: AC
  Filled 2019-04-05 (×2): qty 2

## 2019-04-05 MED ORDER — SODIUM CHLORIDE 0.9 % IV SOLN
INTRAVENOUS | Status: DC
  Administered 2019-04-05 (×3): via INTRAVENOUS
  Administered 2019-04-06: 16:00:00 75 mL/h via INTRAVENOUS
  Administered 2019-04-07: 07:00:00 via INTRAVENOUS

## 2019-04-05 MED ORDER — SODIUM CHLORIDE 0.9 % IV SOLN
2.0000 g | Freq: Once | INTRAVENOUS | Status: AC
  Administered 2019-04-05: 04:00:00 2 g via INTRAVENOUS
  Filled 2019-04-05: qty 20

## 2019-04-05 MED ORDER — FENTANYL CITRATE (PF) 100 MCG/2ML IJ SOLN
50.0000 ug | Freq: Once | INTRAMUSCULAR | Status: AC
  Administered 2019-04-05: 50 ug via INTRAVENOUS
  Filled 2019-04-05: qty 2

## 2019-04-05 MED ORDER — MORPHINE SULFATE (PF) 2 MG/ML IV SOLN
2.0000 mg | INTRAVENOUS | Status: DC | PRN
  Administered 2019-04-05 – 2019-04-07 (×4): 2 mg via INTRAVENOUS
  Filled 2019-04-05 (×4): qty 1

## 2019-04-05 MED ORDER — SODIUM CHLORIDE 0.9 % IV SOLN
2.0000 g | INTRAVENOUS | Status: DC
  Administered 2019-04-06 – 2019-04-07 (×2): 2 g via INTRAVENOUS
  Filled 2019-04-05: qty 2
  Filled 2019-04-05: qty 20

## 2019-04-05 MED ORDER — SODIUM CHLORIDE 0.9 % IV BOLUS
1000.0000 mL | Freq: Once | INTRAVENOUS | Status: AC
  Administered 2019-04-05: 1000 mL via INTRAVENOUS

## 2019-04-05 MED ORDER — ENSURE PRE-SURGERY PO LIQD
296.0000 mL | Freq: Once | ORAL | Status: AC
  Administered 2019-04-06: 05:00:00 296 mL via ORAL
  Filled 2019-04-05 (×2): qty 296

## 2019-04-05 MED ORDER — ONDANSETRON HCL 4 MG/2ML IJ SOLN
4.0000 mg | Freq: Once | INTRAMUSCULAR | Status: AC
  Administered 2019-04-05: 04:00:00 4 mg via INTRAVENOUS
  Filled 2019-04-05: qty 2

## 2019-04-05 MED ORDER — ONDANSETRON HCL 4 MG/2ML IJ SOLN: 4.0000 mg | Freq: Four times a day (QID) | INTRAMUSCULAR | Status: DC | PRN

## 2019-04-05 NOTE — ED Notes (Signed)
ED TO INPATIENT HANDOFF REPORT  ED Nurse Name and Phone #:    S Name/Age/Gender Danielle PetersAntoinette R [name] 48 y.o. female Room/Bed: 022C/022C  Code Status   Code Status: Full Code  Home/SNF/Other Home Patient oriented to: self, place, time and situation Is this baseline? Yes   Triage Complete: Triage complete  Chief Complaint R sided stomach pain  Triage Note Pt reports sharp, cramping right sided abd pain for the past 3 weeks. Denies n/v. Pt had outpatient US done that showed "acute cholecystitis with a gallstone in the neck of the gallbladder at the junction with cystic duct". Pt a.o, nad noted.   Allergies Allergies  Allergen Reactions  . Septra [Bactrim] Nausea And Vomiting    Level of Care/Admitting Diagnosis ED Disposition    ED Disposition Condition Comment   Admit  [facility] [[id number]]  Level of Care: Med-Surg [16]  Covid Evaluation: Confirmed COVID Negative  Date Laboratory Confirmed COVID Negative: 04/05/2019  Diagnosis: Cholecystitis [130865][192631]  Admitting Physician: CCS, MD [[id number]]  Attending Physician: CCS, MD [[id number]]  PT Class (Do Not Modify): Observation [104]  PT Acc Code (Do Not Modify): Observation [10022]       B Medical/Surgery History Past Medical History:  Diagnosis Date  . Bacterial vaginosis    RECURRENT  . HSV (herpes simplex virus) anogenital infection   . Migraines    Past Surgical History:  Procedure Laterality Date  . CESAREAN SECTION  12/26/2000  . CYST EXCISION N/A 04/21/2017   Procedure: EXCISION OF SEBACEOUS CYST OF BILATERAL THIGHS AND LEFT LABIA;  Surgeon: Berna Bueonnor, Chelsea A, MD;  Location: [facility];  Service: General;  Laterality: N/A;  . INCISE AND DRAIN ABCESS  [id number]   LEFT LABIAL-SKENE   . TUBAL LIGATION  07/04/2007     A IV Location/Drains/Wounds Patient Lines/Drains/Airways Status   Active Line/Drains/Airways    Name:   Placement date:   Placement time:   Site:   Days:    Peripheral IV 04/05/19 Right Antecubital   04/05/19    0353    Antecubital   less than 1   Incision (Closed) 04/21/17 Perineum Other (Comment)   04/21/17    0812     714   Incision (Closed) 04/21/17 Groin Right   04/21/17    0843     714   Incision (Closed) 04/21/17 Groin Left   04/21/17    0843     714          Intake/Output Last 24 hours  Intake/Output Summary (Last 24 hours) at 04/05/2019 [id number] Last data filed at 04/05/2019 [id number] Gross per 24 hour  Intake 1100 ml  Output -  Net 1100 ml    Labs/Imaging Results for orders placed or performed during the hospital encounter of 04/04/19 (from the past 48 hour(s))  Lipase, blood     Status: None   Collection Time: 04/04/19  6:19 PM  Result Value Ref Range   Lipase 29 11 - 51 U/L    Comment: Performed at [facility], [address] [place], [place] [address]  Comprehensive metabolic panel     Status: Abnormal   Collection Time: 04/04/19  6:19 PM  Result Value Ref Range   Sodium 139 135 - 145 mmol/L   Potassium 3.3 (L) 3.5 - 5.1 mmol/L   Chloride 107 98 - 111 mmol/L   CO2 24 22 - 32 mmol/L   Glucose, Bld 119 (H) 70 - 99 mg/dL  BUN 7 6 - 20 mg/dL   Creatinine, Ser 0.82 0.44 - 1.00 mg/dL   Calcium 9.1 8.9 - 10.3 mg/dL   Total Protein 7.3 6.5 - 8.1 g/dL   Albumin 4.0 3.5 - 5.0 g/dL   AST 12 (L) 15 - 41 U/L   ALT 11 0 - 44 U/L   Alkaline Phosphatase 68 38 - 126 U/L   Total Bilirubin 0.3 0.3 - 1.2 mg/dL   GFR calc non Af Amer >60 >60 mL/min   GFR calc Af Amer >60 >60 mL/min   Anion gap 8 5 - 15    Comment: Performed at [facility], [place] [address] [place], [place] [address]  CBC     Status: None   Collection Time: 04/04/19  6:19 PM  Result Value Ref Range   WBC 9.3 4.0 - 10.5 K/uL   RBC 4.32 3.87 - 5.11 MIL/uL   Hemoglobin 12.5 12.0 - 15.0 g/dL   HCT 39.6 36.0 - 46.0 %   MCV 91.7 80.0 - 100.0 fL   MCH 28.9 26.0 - 34.0 pg   MCHC 31.6 30.0 - 36.0 g/dL   RDW 12.4 11.5 - 15.5 %   Platelets 364 150 - 400 K/uL    nRBC 0.0 0.0 - 0.2 %    Comment: Performed at [facility], [place] [address], [place], [place] [address]  Urinalysis, Routine w reflex microscopic     Status: Abnormal   Collection Time: 04/04/19  6:19 PM  Result Value Ref Range   Color, Urine YELLOW YELLOW   APPearance HAZY (A) CLEAR   Specific Gravity, Urine 1.019 1.005 - 1.030   pH 5.0 5.0 - 8.0   Glucose, UA NEGATIVE NEGATIVE mg/dL   Hgb urine dipstick LARGE (A) NEGATIVE   Bilirubin Urine NEGATIVE NEGATIVE   Ketones, ur NEGATIVE NEGATIVE mg/dL   Protein, ur NEGATIVE NEGATIVE mg/dL   Nitrite NEGATIVE NEGATIVE   Leukocytes,Ua MODERATE (A) NEGATIVE   RBC / HPF >50 (H) 0 - 5 RBC/hpf   WBC, UA 21-50 0 - 5 WBC/hpf   Bacteria, UA RARE (A) NONE SEEN   Squamous Epithelial / LPF 0-5 0 - 5   Mucus PRESENT     Comment: Performed at [facility], [place] [address] [place],  [address]  I-Stat beta hCG blood, ED     Status: None   Collection Time: 04/04/19  6:20 PM  Result Value Ref Range   I-stat hCG, quantitative <5.0 <5 mIU/mL   Comment 3            Comment:   GEST. AGE      CONC.  (mIU/mL)   <=1 WEEK        5 - 50     2 WEEKS       50 - 500     3 WEEKS       100 - 10,000     4 WEEKS     1,000 - 30,000        FEMALE AND NON-PREGNANT FEMALE:     LESS THAN 5 mIU/mL    US Abdomen Complete  Result Date: 04/04/2019 CLINICAL DATA:  Upper abdominal pain EXAM: ABDOMEN ULTRASOUND COMPLETE COMPARISON:  None. FINDINGS: Gallbladder: There is a 1.1 cm echogenic focus consistent with a gallstone adherent in the neck of the gallbladder at the junction with the cystic duct. There is sludge elsewhere in the gallbladder. There is no appreciable gallbladder wall thickening or pericholecystic  fluid. Patient is focally tender over the gallbladder. Common bile duct: Diameter: 5 mm. No intrahepatic, common hepatic, or common bile duct dilatation. Liver: No focal lesion identified. Liver echogenicity overall is mildly increased with apparent  fatty sparing near the gallbladder fossa. Portal vein is patent on color Doppler imaging with normal direction of blood flow towards the liver. IVC: No abnormality visualized. Pancreas: No pancreatic mass or inflammatory focus. Spleen: Size and appearance within normal limits. Right Kidney: Length: 10.4 cm. Echogenicity within normal limits. No mass or hydronephrosis visualized. Left Kidney: Length: 10.5 cm. Echogenicity within normal limits. No mass or hydronephrosis visualized. Abdominal aorta: No aneurysm visualized. Other findings: No demonstrable ascites. IMPRESSION: 1. 1.1 cm in length gallstone adherent in the neck of the gallbladder at the junction with the cystic duct. Sludge in gallbladder. Patient is focally tender over the gallbladder. These are findings concerning for acute cholecystitis. 2. Mild apparent fatty infiltration in the liver. Mild fatty sparing near the gallbladder fossa. 3.  Study otherwise unremarkable. These results will be called to the ordering clinician or representative by the Radiologist Assistant, and communication documented in the PACS or zVision Dashboard. Electronically Signed   By: Bretta Bang III M.D.   On: 04/04/2019 12:56    Pending Labs Unresulted Labs (From admission, onward)    Start     Ordered   04/05/19 0355  SARS CORONAVIRUS 2 (TAT 6-24 HRS) Nasopharyngeal Nasopharyngeal Swab  (Asymptomatic/Tier 2 Patients Labs)  Once,   STAT    Question Answer Comment  Is this test for diagnosis or screening Screening   Symptomatic for COVID-19 as defined by CDC No   Hospitalized for COVID-19 No   Admitted to ICU for COVID-19 No   Previously tested for COVID-19 No   Resident in a congregate (group) care setting No   Employed in healthcare setting No   Pregnant No      04/05/19 0355   04/05/19 0251  Urine Culture  Once,   STAT     04/05/19 0250          Vitals/Pain Today's Vitals   04/05/19 0145 04/05/19 0545 04/05/19 0553 04/05/19 0600  BP: (!)  154/90 132/72  139/89  Pulse: 67 69  81  Resp: 18 20    Temp:      TempSrc:      SpO2: 100% 99%  100%  PainSc:   0-No pain     Isolation Precautions No active isolations  Medications Medications  0.9 %  sodium chloride infusion ( Intravenous New Bag/Given 04/05/19 0515)  cefTRIAXone (ROCEPHIN) 2 g in sodium chloride 0.9 % 100 mL IVPB (2 g Intravenous Not Given 04/05/19 0503)  acetaminophen (TYLENOL) tablet 650 mg (has no administration in time range)    Or  acetaminophen (TYLENOL) suppository 650 mg (has no administration in time range)  morphine 2 MG/ML injection 2 mg (has no administration in time range)  ondansetron (ZOFRAN-ODT) disintegrating tablet 4 mg (has no administration in time range)    Or  ondansetron (ZOFRAN) injection 4 mg (has no administration in time range)  gabapentin (NEURONTIN) capsule 100 mg (has no administration in time range)  acetaminophen (TYLENOL) tablet 1,000 mg (has no administration in time range)  ketorolac (TORADOL) 15 MG/ML injection 15 mg (has no administration in time range)  sodium chloride flush (NS) 0.9 % injection 3 mL (3 mLs Intravenous Given 04/05/19 0417)  fentaNYL (SUBLIMAZE) injection 50 mcg (50 mcg Intravenous Given 04/05/19 0401)  ondansetron (ZOFRAN) injection 4  mg (4 mg Intravenous Given 04/05/19 0401)  sodium chloride 0.9 % bolus 1,000 mL (0 mLs Intravenous Stopped 04/05/19 0517)  cefTRIAXone (ROCEPHIN) 2 g in sodium chloride 0.9 % 100 mL IVPB (0 g Intravenous Stopped 04/05/19 0516)    Mobility walks Low fall risk   Focused Assessments Cardiac Assessment Handoff:    Lab Results  Component Value Date   CKTOTAL 73 05/24/2010   No results found for: DDIMER Does the Patient currently have chest pain? No     R Recommendations: See Admitting Provider Note  Report given to:   Additional Notes: .

## 2019-04-05 NOTE — ED Provider Notes (Signed)
[facility] Provider Note   CSN: [id number] Arrival date & time: 04/04/19  1750     History   Chief Complaint Chief Complaint  Patient presents with  . Abdominal Pain    HPI Danielle [name] is a 48 y.o. female.     Patient presents with 3 weeks of intermittent upper abdominal pain worse in the past 3 days.  Reports constant pain for the past 2 days associated with nausea.  She saw her PCP who scheduled an outpatient ultrasound.  This showed cholecystitis with a stone lodged in the gallbladder neck.  She was told to come to the ED tonight.  She is had nausea but no vomiting.  No fever.  No pain with urination or blood in the urine.  No vaginal bleeding or discharge.  No chest pain or shortness of breath.  Reports the pain is in her right upper quadrant radiates to her right lower abdomen and has been getting worse for the past several days. No chest pain or shortness of breath. Only other abdominal surgery is C-section. Denies any history of acid reflux or ulcers.  The history is provided by the patient.  Abdominal Pain Associated symptoms: fatigue and nausea   Associated symptoms: no cough, no dysuria, no fever, no hematuria, no shortness of breath and no vomiting     Past Medical History:  Diagnosis Date  . Bacterial vaginosis    RECURRENT  . HSV (herpes simplex virus) anogenital infection   . Migraines     Patient Active Problem List   Diagnosis Date Noted  . H/O tubal ligation 05/05/2015  . HSV (herpes simplex virus) anogenital infection 01/28/2013    Past Surgical History:  Procedure Laterality Date  . CESAREAN SECTION  12/26/2000  . CYST EXCISION N/A 04/21/2017   Procedure: EXCISION OF SEBACEOUS CYST OF BILATERAL THIGHS AND LEFT LABIA;  Surgeon: Berna Bueonnor, Chelsea A, MD;  Location: [facility];  Service: General;  Laterality: N/A;  . INCISE AND DRAIN ABCESS  [id number]   LEFT LABIAL-SKENE   . TUBAL LIGATION   07/04/2007     OB History    Gravida  6   Para  3   Term      Preterm      AB  2   Living  3     SAB  1   TAB  1   Ectopic      Multiple      Live Births               Home Medications    Prior to Admission medications   Medication Sig Start Date End Date Taking? Authorizing Provider  Butalbital-APAP-Caffeine 50-300-40 MG CAPS Take 1 tablet by mouth daily as needed (FOR MIGRAINES).  04/24/15   [provider]  fluconazole (DIFLUCAN) 150 MG tablet TAKE 1 TABLET BY MOUTH AS ONE DOSE 08/27/18   Harrington ChallengerYoung, Nancy J, NP  montelukast (SINGULAIR) 10 MG tablet Take 10 mg by mouth daily. 02/13/18   [provider]  oxyCODONE-acetaminophen (PERCOCET/ROXICET) 5-325 MG tablet Take 1 tablet by mouth every 6 (six) hours as needed for severe pain. Patient not taking: Reported on 04/17/2018 04/21/17   Berna Bueonnor, Chelsea A, MD  SUMAtriptan (IMITREX) 50 MG tablet Take 1 tablet (50 mg total) by mouth every 2 (two) hours as needed for migraine. May repeat in 2 hours if headache persists or recurs, no more than 2 tablets in 24 hours. 05/18/16  Huel Cote, NP  valACYclovir (VALTREX) 500 MG tablet Take twice daily 04/17/18   Huel Cote, NP  Grant Ruts INHUB 250-50 MCG/DOSE AEPB Inhale 1 puff into the lungs 2 (two) times daily. 02/13/18   [provider]    Family History Family History  Problem Relation Age of Onset  . Diabetes Mother   . Hypertension Mother   . Diabetes Father     Social History Social History   Tobacco Use  . Smoking status: Never Smoker  . Smokeless tobacco: Never Used  Substance Use Topics  . Alcohol use: No  . Drug use: No     Allergies   Septra [bactrim]   Review of Systems Review of Systems  Constitutional: Positive for activity change, appetite change and fatigue. Negative for fever.  HENT: Negative for congestion and rhinorrhea.   Eyes: Negative for photophobia.  Respiratory: Negative for cough, chest tightness and  shortness of breath.   Gastrointestinal: Positive for abdominal pain and nausea. Negative for vomiting.  Genitourinary: Negative for dysuria and hematuria.  Musculoskeletal: Negative for back pain.  Skin: Negative for rash.  Neurological: Negative for dizziness, weakness and headaches.   all other systems are negative except as noted in the HPI and PMH.     Physical Exam Updated Vital Signs BP (!) 154/90 (BP Location: Right Arm)   Pulse 67   Temp 98.8 F (37.1 C) (Oral)   Resp 18   LMP 04/03/2019   SpO2 100%   Physical Exam Vitals signs and nursing note reviewed.  Constitutional:      General: She is not in acute distress.    Appearance: She is well-developed.  HENT:     Head: Normocephalic and atraumatic.     Mouth/Throat:     Pharynx: No oropharyngeal exudate.  Eyes:     Conjunctiva/sclera: Conjunctivae normal.     Pupils: Pupils are equal, round, and reactive to light.  Neck:     Musculoskeletal: Normal range of motion and neck supple.     Comments: No meningismus. Cardiovascular:     Rate and Rhythm: Normal rate and regular rhythm.     Heart sounds: Normal heart sounds. No murmur.  Pulmonary:     Effort: Pulmonary effort is normal. No respiratory distress.     Breath sounds: Normal breath sounds.  Abdominal:     Palpations: Abdomen is soft.     Tenderness: There is abdominal tenderness. There is guarding. There is no rebound.     Comments: Right upper quadrant and epigastric tenderness with voluntary guarding, mild right lower quadrant tenderness with no guarding or rebound  Musculoskeletal: Normal range of motion.        General: No tenderness.     Comments: No CVAT   Skin:    General: Skin is warm.     Capillary Refill: Capillary refill takes less than 2 seconds.  Neurological:     General: No focal deficit present.     Mental Status: She is alert and oriented to person, place, and time. Mental status is at baseline.     Cranial Nerves: No cranial nerve  deficit.     Motor: No abnormal muscle tone.     Coordination: Coordination normal.     Comments: No ataxia on finger to nose bilaterally. No pronator drift. 5/5 strength throughout. CN 2-12 intact.Equal grip strength. Sensation intact.   Psychiatric:        Behavior: Behavior normal.      ED Treatments /  Results  Labs (all labs ordered are listed, but only abnormal results are displayed) Labs Reviewed  COMPREHENSIVE METABOLIC PANEL - Abnormal; Notable for the following components:      Result Value   Potassium 3.3 (*)    Glucose, Bld 119 (*)    AST 12 (*)    All other components within normal limits  URINALYSIS, ROUTINE W REFLEX MICROSCOPIC - Abnormal; Notable for the following components:   APPearance HAZY (*)    Hgb urine dipstick LARGE (*)    Leukocytes,Ua MODERATE (*)    RBC / HPF >50 (*)    Bacteria, UA RARE (*)    All other components within normal limits  URINE CULTURE  SARS CORONAVIRUS 2 (TAT 6-24 HRS)  LIPASE, BLOOD  CBC  I-STAT BETA HCG BLOOD, ED ([facility], [facility], [facility] ONLY)    EKG None  Radiology US Abdomen Complete  Result Date: 04/04/2019 CLINICAL DATA:  Upper abdominal pain EXAM: ABDOMEN ULTRASOUND COMPLETE COMPARISON:  None. FINDINGS: Gallbladder: There is a 1.1 cm echogenic focus consistent with a gallstone adherent in the neck of the gallbladder at the junction with the cystic duct. There is sludge elsewhere in the gallbladder. There is no appreciable gallbladder wall thickening or pericholecystic fluid. Patient is focally tender over the gallbladder. Common bile duct: Diameter: 5 mm. No intrahepatic, common hepatic, or common bile duct dilatation. Liver: No focal lesion identified. Liver echogenicity overall is mildly increased with apparent fatty sparing near the gallbladder fossa. Portal vein is patent on color Doppler imaging with normal direction of blood flow towards the liver. IVC: No abnormality visualized. Pancreas: No pancreatic mass or inflammatory focus.  Spleen: Size and appearance within normal limits. Right Kidney: Length: 10.4 cm. Echogenicity within normal limits. No mass or hydronephrosis visualized. Left Kidney: Length: 10.5 cm. Echogenicity within normal limits. No mass or hydronephrosis visualized. Abdominal aorta: No aneurysm visualized. Other findings: No demonstrable ascites. IMPRESSION: 1. 1.1 cm in length gallstone adherent in the neck of the gallbladder at the junction with the cystic duct. Sludge in gallbladder. Patient is focally tender over the gallbladder. These are findings concerning for acute cholecystitis. 2. Mild apparent fatty infiltration in the liver. Mild fatty sparing near the gallbladder fossa. 3.  Study otherwise unremarkable. These results will be called to the ordering clinician or representative by the Radiologist Assistant, and communication documented in the PACS or zVision Dashboard. Electronically Signed   By: Bretta Bang III M.D.   On: 04/04/2019 12:56    Procedures Procedures (including critical care time)  Medications Ordered in ED Medications  sodium chloride flush (NS) 0.9 % injection 3 mL (has no administration in time range)  fentaNYL (SUBLIMAZE) injection 50 mcg (has no administration in time range)  ondansetron (ZOFRAN) injection 4 mg (has no administration in time range)  sodium chloride 0.9 % bolus 1,000 mL (has no administration in time range)  cefTRIAXone (ROCEPHIN) 2 g in sodium chloride 0.9 % 100 mL IVPB (has no administration in time range)     Initial Impression / Assessment and Plan / ED Course  I have reviewed the triage vital signs and the nursing notes.  Pertinent labs & imaging results that were available during my care of the patient were reviewed by me and considered in my medical decision making (see chart for details).       Patient with cholecystitis on outpatient ultrasound.  Has been having intermittent pain for several weeks worse in the past several days.  No fever.  Her labs showed normal LFTs and lipase.  White blood cell count is normal.  Patient given IV fluids.  Made n.p.o.  Pain and nausea control given.  Ultrasound reviewed shows evidence of cholecystitis and gallstone lodged in the gallbladder neck.  Urinalysis sent for culture.  She has no urinary tract infection symptoms.  Discussed with Dr. Dwain Sarna who will evaluate.  Urine culture sent.   Final Clinical Impressions(s) / ED Diagnoses   Final diagnoses:  Acute cholecystitis    ED Discharge Orders    None       Samauri Kellenberger, Jeannett Senior, MD 04/05/19 [phone]

## 2019-04-05 NOTE — Progress Notes (Signed)
Patient admitted to unit from emergency department, VSS. Patient settled in bed and oriented to unit and hospital routine. Pt resting comfortably in bed with call bell in reach and side rails up. Instructed patient to utilize call bell for assistance. Will continue to monitor closely for remainder of shift. 

## 2019-04-05 NOTE — H&P (Signed)
Danielle [name] is an 48 y.o. female.   Chief Complaint: ab pain HPI: 44 yof who is otherwise healthy presents with progressive initially intermittent but now constant ruq pain. This is radiating to side and back. Some nausea, no emesis, has loss of appetite. No fevers. Nothing making it better. She had outpt [place] that showed gallstones and cholecystitis and was sent to the er.    Past Medical History:  Diagnosis Date  . Bacterial vaginosis    RECURRENT  . HSV (herpes simplex virus) anogenital infection   . Migraines     Past Surgical History:  Procedure Laterality Date  . CESAREAN SECTION  12/26/2000  . CYST EXCISION N/A 04/21/2017   Procedure: EXCISION OF SEBACEOUS CYST OF BILATERAL THIGHS AND LEFT LABIA;  Surgeon: Berna Bue, MD;  Location: [facility];  Service: General;  Laterality: N/A;  . INCISE AND DRAIN ABCESS  [id number]   LEFT LABIAL-SKENE   . TUBAL LIGATION  07/04/2007    Family History  Problem Relation Age of Onset  . Diabetes Mother   . Hypertension Mother   . Diabetes Father    Social History:  reports that she has never smoked. She has never used smokeless tobacco. She reports that she does not drink alcohol or use drugs.  Allergies:  Allergies  Allergen Reactions  . Septra [Bactrim] Nausea And Vomiting    meds none  Results for orders placed or performed during the hospital encounter of 04/04/19 (from the past 48 hour(s))  Lipase, blood     Status: None   Collection Time: 04/04/19  6:19 PM  Result Value Ref Range   Lipase 29 11 - 51 U/L    Comment: Performed at [facility], [address] [place], [place] [address]  Comprehensive metabolic panel     Status: Abnormal   Collection Time: 04/04/19  6:19 PM  Result Value Ref Range   Sodium 139 135 - 145 mmol/L   Potassium 3.3 (L) 3.5 - 5.1 mmol/L   Chloride 107 98 - 111 mmol/L   CO2 24 22 - 32 mmol/L   Glucose, Bld 119 (H) 70 - 99 mg/dL   BUN 7 6 - 20 mg/dL   Creatinine, Ser  4.12 0.44 - 1.00 mg/dL   Calcium 9.1 8.9 - 87.8 mg/dL   Total Protein 7.3 6.5 - 8.1 g/dL   Albumin 4.0 3.5 - 5.0 g/dL   AST 12 (L) 15 - 41 U/L   ALT 11 0 - 44 U/L   Alkaline Phosphatase 68 38 - 126 U/L   Total Bilirubin 0.3 0.3 - 1.2 mg/dL   GFR calc non Af Amer >60 >60 mL/min   GFR calc Af Amer >60 >60 mL/min   Anion gap 8 5 - 15    Comment: Performed at [facility], [address], [place], [place] [address]  CBC     Status: None   Collection Time: 04/04/19  6:19 PM  Result Value Ref Range   WBC 9.3 4.0 - 10.5 K/uL   RBC 4.32 3.87 - 5.11 MIL/uL   Hemoglobin 12.5 12.0 - 15.0 g/dL   HCT 09.4 70.9 - 62.8 %   MCV 91.7 80.0 - 100.0 fL   MCH 28.9 26.0 - 34.0 pg   MCHC 31.6 30.0 - 36.0 g/dL   RDW 36.6 29.4 - 76.5 %   Platelets 364 150 - 400 K/uL   nRBC 0.0 0.0 - 0.2 %    Comment:  Performed at [facility], [address] [place], [place] [address]  Urinalysis, Routine w reflex microscopic     Status: Abnormal   Collection Time: 04/04/19  6:19 PM  Result Value Ref Range   Color, Urine YELLOW YELLOW   APPearance HAZY (A) CLEAR   Specific Gravity, Urine 1.019 1.005 - 1.030   pH 5.0 5.0 - 8.0   Glucose, UA NEGATIVE NEGATIVE mg/dL   Hgb urine dipstick LARGE (A) NEGATIVE   Bilirubin Urine NEGATIVE NEGATIVE   Ketones, ur NEGATIVE NEGATIVE mg/dL   Protein, ur NEGATIVE NEGATIVE mg/dL   Nitrite NEGATIVE NEGATIVE   Leukocytes,Ua MODERATE (A) NEGATIVE   RBC / HPF >50 (H) 0 - 5 RBC/hpf   WBC, UA 21-50 0 - 5 WBC/hpf   Bacteria, UA RARE (A) NONE SEEN   Squamous Epithelial / LPF 0-5 0 - 5   Mucus PRESENT     Comment: Performed at [facility], [address] [place], [place] [address]  I-Stat beta hCG blood, ED     Status: None   Collection Time: 04/04/19  6:20 PM  Result Value Ref Range   I-stat hCG, quantitative <5.0 <5 mIU/mL   Comment 3            Comment:   GEST. AGE      CONC.  (mIU/mL)   <=1 WEEK        5 - 50     2 WEEKS       50 - 500     3 WEEKS        100 - 10,000     4 WEEKS     1,000 - 30,000        FEMALE AND NON-PREGNANT FEMALE:     LESS THAN 5 mIU/mL    [place] Abdomen Complete  Result Date: 04/04/2019 CLINICAL DATA:  Upper abdominal pain EXAM: ABDOMEN ULTRASOUND COMPLETE COMPARISON:  None. FINDINGS: Gallbladder: There is a 1.1 cm echogenic focus consistent with a gallstone adherent in the neck of the gallbladder at the junction with the cystic duct. There is sludge elsewhere in the gallbladder. There is no appreciable gallbladder wall thickening or pericholecystic fluid. Patient is focally tender over the gallbladder. Common bile duct: Diameter: 5 mm. No intrahepatic, common hepatic, or common bile duct dilatation. Liver: No focal lesion identified. Liver echogenicity overall is mildly increased with apparent fatty sparing near the gallbladder fossa. Portal vein is patent on color Doppler imaging with normal direction of blood flow towards the liver. IVC: No abnormality visualized. Pancreas: No pancreatic mass or inflammatory focus. Spleen: Size and appearance within normal limits. Right Kidney: Length: 10.4 cm. Echogenicity within normal limits. No mass or hydronephrosis visualized. Left Kidney: Length: 10.5 cm. Echogenicity within normal limits. No mass or hydronephrosis visualized. Abdominal aorta: No aneurysm visualized. Other findings: No demonstrable ascites. IMPRESSION: 1. 1.1 cm in length gallstone adherent in the neck of the gallbladder at the junction with the cystic duct. Sludge in gallbladder. Patient is focally tender over the gallbladder. These are findings concerning for acute cholecystitis. 2. Mild apparent fatty infiltration in the liver. Mild fatty sparing near the gallbladder fossa. 3.  Study otherwise unremarkable. These results will be called to the ordering clinician or representative by the Radiologist Assistant, and communication documented in the PACS or zVision Dashboard. Electronically Signed   By: Bretta BangWilliam  Woodruff III M.D.    On: 04/04/2019 12:56    Review of Systems  Gastrointestinal: Positive for abdominal  pain and nausea. Negative for vomiting.    Blood pressure (!) 154/90, pulse 67, temperature 98.8 F (37.1 C), temperature source Oral, resp. rate 18, last menstrual period 04/03/2019, SpO2 100 %. Physical Exam  Vitals reviewed. Constitutional: She is oriented to person, place, and time. She appears well-developed and well-nourished.  HENT:  Head: Normocephalic and atraumatic.  Eyes: No scleral icterus.  Neck: Neck supple.  Cardiovascular: Normal rate, regular rhythm and normal heart sounds.  Respiratory: Effort normal and breath sounds normal.  GI: Soft. Bowel sounds are normal. She exhibits no distension. There is abdominal tenderness in the right upper quadrant. There is positive Murphy's sign.  Lymphadenopathy:    She has no cervical adenopathy.  Neurological: She is alert and oriented to person, place, and time.  Skin: Skin is warm and dry.  Psychiatric: She has a normal mood and affect.     Assessment/Plan Cholecystitis Admit, abx, npo, plan for lap chole today/tomorrow depending on schedule   Rolm Bookbinder, MD 04/05/2019, 3:43 AM

## 2019-04-05 NOTE — ED Notes (Signed)
Currently abdominal pain rated at 8 out of 10, been having pain on and off for a week.

## 2019-04-06 ENCOUNTER — Encounter (HOSPITAL_COMMUNITY): Payer: Self-pay | Admitting: Certified Registered"

## 2019-04-06 ENCOUNTER — Observation Stay (HOSPITAL_COMMUNITY): Payer: No Typology Code available for payment source | Admitting: Anesthesiology

## 2019-04-06 ENCOUNTER — Encounter (HOSPITAL_COMMUNITY)
Admission: EM | Disposition: A | Discharge status: Home / Self Care | Payer: Self-pay | Source: Home / Self Care | Attending: Emergency Medicine

## 2019-04-06 HISTORY — PX: CHOLECYSTECTOMY: SHX55

## 2019-04-06 LAB — URINE CULTURE: Culture: NO GROWTH

## 2019-04-06 LAB — COMPREHENSIVE METABOLIC PANEL
ALT: 11 U/L (ref 0–44)
AST: 12 U/L — ABNORMAL LOW (ref 15–41)
Albumin: 3.3 g/dL — ABNORMAL LOW (ref 3.5–5.0)
Alkaline Phosphatase: 63 U/L (ref 38–126)
Anion gap: 8 (ref 5–15)
BUN: <5 mg/dL — ABNORMAL LOW (ref 6–20)
CO2: 24 mmol/L (ref 22–32)
Calcium: 8.4 mg/dL — ABNORMAL LOW (ref 8.9–10.3)
Chloride: 107 mmol/L (ref 98–111)
Creatinine, Ser: 0.71 mg/dL (ref 0.44–1.00)
GFR calc Af Amer: >60 mL/min (ref >60)
GFR calc non Af Amer: >60 mL/min (ref >60)
Glucose, Bld: 87 mg/dL (ref 70–99)
Potassium: 3.4 mmol/L — ABNORMAL LOW (ref 3.5–5.1)
Sodium: 139 mmol/L (ref 135–145)
Total Bilirubin: 0.2 mg/dL — ABNORMAL LOW (ref 0.3–1.2)
Total Protein: 6.2 g/dL — ABNORMAL LOW (ref 6.5–8.1)

## 2019-04-06 LAB — CBC
HCT: 34.2 % — ABNORMAL LOW (ref 36.0–46.0)
Hemoglobin: 11.1 g/dL — ABNORMAL LOW (ref 12.0–15.0)
MCH: 29.1 pg (ref 26.0–34.0)
MCHC: 32.5 g/dL (ref 30.0–36.0)
MCV: 89.5 fL (ref 80.0–100.0)
Platelets: 337 10*3/uL (ref 150–400)
RBC: 3.82 MIL/uL — ABNORMAL LOW (ref 3.87–5.11)
RDW: 12.3 % (ref 11.5–15.5)
WBC: 6.5 10*3/uL (ref 4.0–10.5)
nRBC: 0 % (ref 0.0–0.2)

## 2019-04-06 SURGERY — LAPAROSCOPIC CHOLECYSTECTOMY WITH INTRAOPERATIVE CHOLANGIOGRAM
Anesthesia: General

## 2019-04-06 MED ORDER — MIDAZOLAM HCL 2 MG/2ML IJ SOLN
INTRAMUSCULAR | Status: AC
  Filled 2019-04-06: qty 2

## 2019-04-06 MED ORDER — GABAPENTIN 100 MG PO CAPS
100.0000 mg | ORAL_CAPSULE | ORAL | Status: AC
  Administered 2019-04-06: 10:00:00 100 mg via ORAL
  Filled 2019-04-06: qty 1

## 2019-04-06 MED ORDER — LIDOCAINE 2% (20 MG/ML) 5 ML SYRINGE
INTRAMUSCULAR | Status: DC | PRN
  Administered 2019-04-06: 60 mg via INTRAVENOUS

## 2019-04-06 MED ORDER — FENTANYL CITRATE (PF) 100 MCG/2ML IJ SOLN
INTRAMUSCULAR | Status: DC | PRN
  Administered 2019-04-06: 100 ug via INTRAVENOUS
  Administered 2019-04-06 (×3): 50 ug via INTRAVENOUS

## 2019-04-06 MED ORDER — PROMETHAZINE HCL 25 MG/ML IJ SOLN: 6.2500 mg | INTRAMUSCULAR | Status: DC | PRN

## 2019-04-06 MED ORDER — FENTANYL CITRATE (PF) 100 MCG/2ML IJ SOLN: 25.0000 ug | INTRAMUSCULAR | Status: DC | PRN

## 2019-04-06 MED ORDER — DEXAMETHASONE SODIUM PHOSPHATE 10 MG/ML IJ SOLN
INTRAMUSCULAR | Status: AC
  Filled 2019-04-06: qty 1

## 2019-04-06 MED ORDER — ROCURONIUM BROMIDE 10 MG/ML (PF) SYRINGE
PREFILLED_SYRINGE | INTRAVENOUS | Status: AC
  Filled 2019-04-06: qty 10

## 2019-04-06 MED ORDER — 0.9 % SODIUM CHLORIDE (POUR BTL) OPTIME
TOPICAL | Status: DC | PRN
  Administered 2019-04-06: 1000 mL

## 2019-04-06 MED ORDER — OXYCODONE-ACETAMINOPHEN 5-325 MG PO TABS
1.0000 | ORAL_TABLET | ORAL | Status: DC | PRN
  Administered 2019-04-06 – 2019-04-07 (×4): 1 via ORAL
  Filled 2019-04-06 (×4): qty 1

## 2019-04-06 MED ORDER — KETOROLAC TROMETHAMINE 15 MG/ML IJ SOLN: 15.0000 mg | INTRAMUSCULAR | Status: DC

## 2019-04-06 MED ORDER — SUGAMMADEX SODIUM 200 MG/2ML IV SOLN
INTRAVENOUS | Status: DC | PRN
  Administered 2019-04-06: 200 mg via INTRAVENOUS

## 2019-04-06 MED ORDER — ESMOLOL HCL 100 MG/10ML IV SOLN
INTRAVENOUS | Status: DC | PRN
  Administered 2019-04-06: 20 mg via INTRAVENOUS

## 2019-04-06 MED ORDER — ACETAMINOPHEN 500 MG PO TABS: 1000.0000 mg | ORAL_TABLET | ORAL | Status: DC

## 2019-04-06 MED ORDER — ONDANSETRON HCL 4 MG/2ML IJ SOLN
INTRAMUSCULAR | Status: DC | PRN
  Administered 2019-04-06: 4 mg via INTRAVENOUS

## 2019-04-06 MED ORDER — LIDOCAINE 2% (20 MG/ML) 5 ML SYRINGE
INTRAMUSCULAR | Status: AC
  Filled 2019-04-06: qty 5

## 2019-04-06 MED ORDER — FENTANYL CITRATE (PF) 250 MCG/5ML IJ SOLN
INTRAMUSCULAR | Status: AC
  Filled 2019-04-06: qty 5

## 2019-04-06 MED ORDER — ESMOLOL HCL 100 MG/10ML IV SOLN
INTRAVENOUS | Status: AC
  Filled 2019-04-06: qty 10

## 2019-04-06 MED ORDER — DEXAMETHASONE SODIUM PHOSPHATE 10 MG/ML IJ SOLN
INTRAMUSCULAR | Status: DC | PRN
  Administered 2019-04-06: 4 mg via INTRAVENOUS

## 2019-04-06 MED ORDER — PROPOFOL 10 MG/ML IV BOLUS
INTRAVENOUS | Status: AC
  Filled 2019-04-06: qty 20

## 2019-04-06 MED ORDER — ACETAMINOPHEN 500 MG PO TABS
1000.0000 mg | ORAL_TABLET | ORAL | Status: AC
  Administered 2019-04-06: 10:00:00 1000 mg via ORAL
  Filled 2019-04-06: qty 2

## 2019-04-06 MED ORDER — KETOROLAC TROMETHAMINE 15 MG/ML IJ SOLN
15.0000 mg | INTRAMUSCULAR | Status: AC
  Administered 2019-04-06: 10:00:00 15 mg via INTRAVENOUS
  Filled 2019-04-06: qty 1

## 2019-04-06 MED ORDER — GABAPENTIN 100 MG PO CAPS: 100.0000 mg | ORAL_CAPSULE | ORAL | Status: DC

## 2019-04-06 MED ORDER — MIDAZOLAM HCL 5 MG/5ML IJ SOLN
INTRAMUSCULAR | Status: DC | PRN
  Administered 2019-04-06: 2 mg via INTRAVENOUS

## 2019-04-06 MED ORDER — BENZONATATE 100 MG PO CAPS
200.0000 mg | ORAL_CAPSULE | Freq: Three times a day (TID) | ORAL | Status: DC | PRN
  Administered 2019-04-06 – 2019-04-07 (×3): 200 mg via ORAL
  Filled 2019-04-06 (×3): qty 2

## 2019-04-06 MED ORDER — SUCCINYLCHOLINE CHLORIDE 20 MG/ML IJ SOLN
INTRAMUSCULAR | Status: DC | PRN
  Administered 2019-04-06: 100 mg via INTRAVENOUS

## 2019-04-06 MED ORDER — ROCURONIUM BROMIDE 10 MG/ML (PF) SYRINGE
PREFILLED_SYRINGE | INTRAVENOUS | Status: DC | PRN
  Administered 2019-04-06: 30 mg via INTRAVENOUS
  Administered 2019-04-06: 15 mg via INTRAVENOUS

## 2019-04-06 MED ORDER — PROPOFOL 10 MG/ML IV BOLUS
INTRAVENOUS | Status: DC | PRN
  Administered 2019-04-06: 150 mg via INTRAVENOUS
  Administered 2019-04-06: 30 mg via INTRAVENOUS

## 2019-04-06 MED ORDER — SUGAMMADEX SODIUM 200 MG/2ML IV SOLN: INTRAVENOUS | Status: DC | PRN

## 2019-04-06 MED ORDER — BUPIVACAINE HCL (PF) 0.25 % IJ SOLN
INTRAMUSCULAR | Status: AC
  Filled 2019-04-06: qty 30

## 2019-04-06 MED ORDER — NALOXONE HCL 0.4 MG/ML IJ SOLN
INTRAMUSCULAR | Status: AC
  Filled 2019-04-06: qty 1

## 2019-04-06 MED ORDER — LACTATED RINGERS IV SOLN
INTRAVENOUS | Status: DC
  Administered 2019-04-06: 11:00:00 via INTRAVENOUS

## 2019-04-06 SURGICAL SUPPLY — 41 items
APPLIER CLIP ROT 10 11.4 M/L (STAPLE) ×3
BLADE CLIPPER SURG (BLADE) IMPLANT
CANISTER SUCT 3000ML PPV (MISCELLANEOUS) ×3 IMPLANT
CHLORAPREP W/TINT 26 (MISCELLANEOUS) ×3 IMPLANT
CLIP APPLIE ROT 10 11.4 M/L (STAPLE) ×1 IMPLANT
COVER MAYO STAND STRL (DRAPES) ×3 IMPLANT
COVER SURGICAL LIGHT HANDLE (MISCELLANEOUS) ×3 IMPLANT
COVER WAND RF STERILE (DRAPES) ×3 IMPLANT
DERMABOND ADVANCED (GAUZE/BANDAGES/DRESSINGS) ×2
DERMABOND ADVANCED .7 DNX12 (GAUZE/BANDAGES/DRESSINGS) ×1 IMPLANT
DRAPE C-ARM 42X72 X-RAY (DRAPES) ×3 IMPLANT
DRAPE WARM FLUID 44X44 (DRAPES) ×3 IMPLANT
ELECT REM PT RETURN 9FT ADLT (ELECTROSURGICAL) ×3
ELECTRODE REM PT RTRN 9FT ADLT (ELECTROSURGICAL) ×1 IMPLANT
GLOVE BIO SURGEON STRL SZ8 (GLOVE) ×3 IMPLANT
GLOVE BIOGEL PI IND STRL 8 (GLOVE) ×1 IMPLANT
GLOVE BIOGEL PI INDICATOR 8 (GLOVE) ×2
GOWN STRL REUS W/ TWL LRG LVL3 (GOWN DISPOSABLE) ×2 IMPLANT
GOWN STRL REUS W/ TWL XL LVL3 (GOWN DISPOSABLE) ×1 IMPLANT
GOWN STRL REUS W/TWL LRG LVL3 (GOWN DISPOSABLE) ×4
GOWN STRL REUS W/TWL XL LVL3 (GOWN DISPOSABLE) ×2
KIT BASIN OR (CUSTOM PROCEDURE TRAY) ×3 IMPLANT
KIT TURNOVER KIT B (KITS) ×3 IMPLANT
NS IRRIG 1000ML POUR BTL (IV SOLUTION) ×3 IMPLANT
PAD ARMBOARD 7.5X6 YLW CONV (MISCELLANEOUS) ×3 IMPLANT
POUCH RETRIEVAL ECOSAC 10 (ENDOMECHANICALS) ×1 IMPLANT
POUCH RETRIEVAL ECOSAC 10MM (ENDOMECHANICALS) ×2
SCISSORS LAP 5X35 DISP (ENDOMECHANICALS) ×3 IMPLANT
SET CHOLANGIOGRAPH 5 50 .035 (SET/KITS/TRAYS/PACK) ×3 IMPLANT
SET IRRIG TUBING LAPAROSCOPIC (IRRIGATION / IRRIGATOR) ×3 IMPLANT
SET TUBE SMOKE EVAC HIGH FLOW (TUBING) ×3 IMPLANT
SLEEVE ENDOPATH XCEL 5M (ENDOMECHANICALS) ×3 IMPLANT
SPECIMEN JAR SMALL (MISCELLANEOUS) ×3 IMPLANT
SUT MNCRL AB 4-0 PS2 18 (SUTURE) ×3 IMPLANT
TOWEL GREEN STERILE (TOWEL DISPOSABLE) ×3 IMPLANT
TOWEL GREEN STERILE FF (TOWEL DISPOSABLE) ×3 IMPLANT
TRAY LAPAROSCOPIC MC (CUSTOM PROCEDURE TRAY) ×3 IMPLANT
TROCAR XCEL BLUNT TIP 100MML (ENDOMECHANICALS) ×3 IMPLANT
TROCAR XCEL NON-BLD 11X100MML (ENDOMECHANICALS) ×3 IMPLANT
TROCAR XCEL NON-BLD 5MMX100MML (ENDOMECHANICALS) ×3 IMPLANT
WATER STERILE IRR 1000ML POUR (IV SOLUTION) ×3 IMPLANT

## 2019-04-06 NOTE — Progress Notes (Signed)
Pt returned to 6N18 after surgery. Received report from [place], [place] in PACU. See reassessment. Will continue to monitor.

## 2019-04-06 NOTE — Transfer of Care (Signed)
Immediate Anesthesia Transfer of Care Note  Patient: Danielle [name]  Procedure(s) Performed: LAPAROSCOPIC CHOLECYSTECTOMY (N/A )  Patient Location: PACU  Anesthesia Type:General  Level of Consciousness: drowsy and patient cooperative  Airway & Oxygen Therapy: Patient Spontanous Breathing and Patient connected to nasal cannula oxygen  Post-op Assessment: Report given to RN, Post -op Vital signs reviewed and stable and Patient moving all extremities  Post vital signs: Reviewed and stable  Last Vitals:  Vitals Value Taken Time  BP 157/88 04/06/19 1246  Temp 36.2 C 04/06/19 1230  Pulse 102 04/06/19 1247  Resp 20 04/06/19 1247  SpO2 88 % 04/06/19 1247  Vitals shown include unvalidated device data.  Last Pain:  Vitals:   04/06/19 1230  TempSrc:   PainSc: Asleep         Complications: No apparent anesthesia complications

## 2019-04-06 NOTE — Progress Notes (Signed)
Pt c/o sore throat/cough from being intubated for surgery.  Requesting something for cough.  Doctor on call paged by message.

## 2019-04-06 NOTE — Progress Notes (Signed)
Subjective: CC: Abd pain Patient tolerated CLD yesterday. No N/V. Still has some RUQ abdominal pain this AM.   Objective: Vital signs in last 24 hours: Temp:  [98.1 F (36.7 C)-98.6 F (37 C)] 98.1 F (36.7 C) (10/03 0539) Pulse Rate:  [65-81] 81 (10/03 0539) Resp:  [18-20] 20 (10/03 0539) BP: (123-152)/(78-87) 123/78 (10/03 0539) SpO2:  [100 %] 100 % (10/03 0539) Last BM Date: 04/03/19  Intake/Output from previous day: 10/02 0701 - 10/03 0700 In: 846 [P.O.:296; I.V.:450; IV Piggyback:100] Out: -  Intake/Output this shift: No intake/output data recorded.  PE: Gen: Awake and alert, NAD Lungs: Normal rate and effort Abd: Soft, ND, RUQ tenderness without peritonitis, +BS  Lab Results:  Recent Labs    04/04/19 1819  WBC 9.3  HGB 12.5  HCT 39.6  PLT 364   BMET Recent Labs    04/04/19 1819  NA 139  K 3.3*  CL 107  CO2 24  GLUCOSE 119*  BUN 7  CREATININE 0.82  CALCIUM 9.1   PT/INR No results for input(s): LABPROT, INR in the last 72 hours. CMP     Component Value Date/Time   NA 139 04/04/2019 1819   K 3.3 (L) 04/04/2019 1819   CL 107 04/04/2019 1819   CO2 24 04/04/2019 1819   GLUCOSE 119 (H) 04/04/2019 1819   BUN 7 04/04/2019 1819   CREATININE 0.82 04/04/2019 1819   CREATININE 0.79 04/17/2018 1614   CALCIUM 9.1 04/04/2019 1819   PROT 7.3 04/04/2019 1819   ALBUMIN 4.0 04/04/2019 1819   AST 12 (L) 04/04/2019 1819   ALT 11 04/04/2019 1819   ALKPHOS 68 04/04/2019 1819   BILITOT 0.3 04/04/2019 1819   GFRNONAA >60 04/04/2019 1819   GFRAA >60 04/04/2019 1819   Lipase     Component Value Date/Time   LIPASE 29 04/04/2019 1819       Studies/Results: US Abdomen Complete  Result Date: 04/04/2019 CLINICAL DATA:  Upper abdominal pain EXAM: ABDOMEN ULTRASOUND COMPLETE COMPARISON:  None. FINDINGS: Gallbladder: There is a 1.1 cm echogenic focus consistent with a gallstone adherent in the neck of the gallbladder at the junction with the cystic  duct. There is sludge elsewhere in the gallbladder. There is no appreciable gallbladder wall thickening or pericholecystic fluid. Patient is focally tender over the gallbladder. Common bile duct: Diameter: 5 mm. No intrahepatic, common hepatic, or common bile duct dilatation. Liver: No focal lesion identified. Liver echogenicity overall is mildly increased with apparent fatty sparing near the gallbladder fossa. Portal vein is patent on color Doppler imaging with normal direction of blood flow towards the liver. IVC: No abnormality visualized. Pancreas: No pancreatic mass or inflammatory focus. Spleen: Size and appearance within normal limits. Right Kidney: Length: 10.4 cm. Echogenicity within normal limits. No mass or hydronephrosis visualized. Left Kidney: Length: 10.5 cm. Echogenicity within normal limits. No mass or hydronephrosis visualized. Abdominal aorta: No aneurysm visualized. Other findings: No demonstrable ascites. IMPRESSION: 1. 1.1 cm in length gallstone adherent in the neck of the gallbladder at the junction with the cystic duct. Sludge in gallbladder. Patient is focally tender over the gallbladder. These are findings concerning for acute cholecystitis. 2. Mild apparent fatty infiltration in the liver. Mild fatty sparing near the gallbladder fossa. 3.  Study otherwise unremarkable. These results will be called to the ordering clinician or representative by the Radiologist Assistant, and communication documented in the PACS or zVision Dashboard. Electronically Signed   By: Lowella Grip III  M.D.   On: 04/04/2019 12:56    Anti-infectives: Anti-infectives (From admission, onward)   Start     Dose/Rate Route Frequency Ordered Stop   04/05/19 0400  cefTRIAXone (ROCEPHIN) 2 g in sodium chloride 0.9 % 100 mL IVPB     2 g 200 mL/hr over 30 Minutes Intravenous Every 24 hours 04/05/19 0356     04/05/19 0300  cefTRIAXone (ROCEPHIN) 2 g in sodium chloride 0.9 % 100 mL IVPB     2 g 200 mL/hr over 30  Minutes Intravenous  Once 04/05/19 0250 04/05/19 0516       Assessment/Plan Acute Cholecystitis - AM labs - Plan for Laparoscopic Cholecystectomy with possible IOC with Dr. Luisa Hart today   FEN - NPO VTE - SCDs ID - Rocephin  Plan:    LOS: 0 days    Jacinto Halim , [facility] 04/06/2019, 8:28 AM Pager: [phone]

## 2019-04-06 NOTE — Anesthesia Procedure Notes (Signed)
Procedure Name: Intubation Date/Time: 04/06/2019 11:14 AM Performed by: Moshe Salisbury, CRNA Pre-anesthesia Checklist: Patient identified, Emergency Drugs available, Suction available and Patient being monitored Patient Re-evaluated:Patient Re-evaluated prior to induction Oxygen Delivery Method: Circle System Utilized Preoxygenation: Pre-oxygenation with 100% oxygen Induction Type: IV induction Ventilation: Mask ventilation without difficulty Laryngoscope Size: Mac and 3 Grade View: Grade I Tube type: Oral Tube size: 7.5 mm Number of attempts: 1 Airway Equipment and Method: Stylet Placement Confirmation: ETT inserted through vocal cords under direct vision,  positive ETCO2 and breath sounds checked- equal and bilateral Secured at: 21 cm Tube secured with: Tape Dental Injury: Teeth and Oropharynx as per pre-operative assessment

## 2019-04-06 NOTE — Plan of Care (Signed)
  Problem: Education: Goal: Knowledge of General Education information will improve Description: Including pain rating scale, medication(s)/side effects and non-pharmacologic comfort measures Outcome: Progressing   Problem: Health Behavior/Discharge Planning: Goal: Ability to manage health-related needs will improve Outcome: Progressing   Problem: Clinical Measurements: Goal: Ability to maintain clinical measurements within normal limits will improve Outcome: Progressing Goal: Respiratory complications will improve Outcome: Progressing   Problem: Activity: Goal: Risk for activity intolerance will decrease Outcome: Progressing   Problem: Nutrition: Goal: Adequate nutrition will be maintained Outcome: Progressing   Problem: Coping: Goal: Level of anxiety will decrease Outcome: Progressing   Problem: Elimination: Goal: Will not experience complications related to urinary retention Outcome: Progressing   Problem: Pain Managment: Goal: General experience of comfort will improve Outcome: Progressing   Problem: Safety: Goal: Ability to remain free from injury will improve Outcome: Progressing   Problem: Skin Integrity: Goal: Risk for impaired skin integrity will decrease Outcome: Progressing   

## 2019-04-06 NOTE — Plan of Care (Signed)

## 2019-04-06 NOTE — Anesthesia Preprocedure Evaluation (Signed)
Anesthesia Evaluation  Patient identified by MRN, date of birth, ID band Patient awake    Reviewed: Allergy & Precautions, NPO status , Patient's Chart, lab work & pertinent test results  Airway Mallampati: II  TM Distance: >3 FB     Dental  (+) Dental Advisory Given   Pulmonary neg pulmonary ROS,    breath sounds clear to auscultation       Cardiovascular negative cardio ROS   Rhythm:Regular Rate:Normal     Neuro/Psych  Headaches,    GI/Hepatic negative GI ROS, Neg liver ROS,   Endo/Other  negative endocrine ROS  Renal/GU negative Renal ROS     Musculoskeletal   Abdominal   Peds  Hematology negative hematology ROS (+)   Anesthesia Other Findings   Reproductive/Obstetrics                             Anesthesia Physical Anesthesia Plan  ASA: II  Anesthesia Plan: General   Post-op Pain Management:    Induction: Intravenous  PONV Risk Score and Plan: 3 and Dexamethasone, Ondansetron, Treatment may vary due to age or medical condition and Midazolam  Airway Management Planned: Oral ETT  Additional Equipment:   Intra-op Plan:   Post-operative Plan: Extubation in OR  Informed Consent: I have reviewed the patients History and Physical, chart, labs and discussed the procedure including the risks, benefits and alternatives for the proposed anesthesia with the patient or authorized representative who has indicated his/her understanding and acceptance.     Dental advisory given  Plan Discussed with: CRNA  Anesthesia Plan Comments:         Anesthesia Quick Evaluation

## 2019-04-06 NOTE — Progress Notes (Signed)
Pre-op report called and given to Robby, RN in Short Stay. All questions answered to satisfaction. OR personnel to transport pt via bed. 

## 2019-04-06 NOTE — Anesthesia Postprocedure Evaluation (Signed)
Anesthesia Post Note  Patient: Tinie Mcgloin Heitzenrater  Procedure(s) Performed: LAPAROSCOPIC CHOLECYSTECTOMY (N/A )     Patient location during evaluation: PACU Anesthesia Type: General Level of consciousness: awake and alert Pain management: pain level controlled Vital Signs Assessment: post-procedure vital signs reviewed and stable Respiratory status: spontaneous breathing, nonlabored ventilation, respiratory function stable and patient connected to nasal cannula oxygen Cardiovascular status: blood pressure returned to baseline and stable Postop Assessment: no apparent nausea or vomiting Anesthetic complications: no    Last Vitals:  Vitals:   04/06/19 1350 04/06/19 1620  BP: 126/72 134/86  Pulse: 88 87  Resp: 18 18  Temp: (!) 36.4 C 36.5 C  SpO2: 95% 97%    Last Pain:  Vitals:   04/06/19 1620  TempSrc: Oral  PainSc: 7                  Tiajuana Amass

## 2019-04-06 NOTE — Op Note (Signed)
Laparoscopic Cholecystectomy  Procedure Note  Indications: This patient presents with symptomatic gallbladder disease and will undergo laparoscopic cholecystectomy.The procedure has been discussed with the patient. Operative and non operative treatments have been discussed. Risks of surgery include bleeding, infection,  Common bile duct injury,  Injury to the stomach,liver, colon,small intestine, abdominal wall,  Diaphragm,  Major blood vessels,  And the need for an open procedure.  Other risks include worsening of medical problems, death,  DVT and pulmonary embolism, and cardiovascular events.   Medical options have also been discussed. The patient has been informed of long term expectations of surgery and non surgical options,  The patient agrees to proceed.    Pre-operative Diagnosis: Calculus of gallbladder with acute cholecystitis, without mention of obstruction  Post-operative Diagnosis: Same  Surgeon: Turner Daniels MD   Assistants: NONE  Anesthesia: General endotracheal anesthesia and Local anesthesia 0.25.% bupivacaine  ASA Class: 2  Procedure Details  The patient was seen again in the Holding Room. The risks, benefits, complications, treatment options, and expected outcomes were discussed with the patient. The possibilities of reaction to medication, pulmonary aspiration, perforation of viscus, bleeding, recurrent infection, finding a normal gallbladder, the need for additional procedures, failure to diagnose a condition, the possible need to convert to an open procedure, and creating a complication requiring transfusion or operation were discussed with the patient. The patient and/or family concurred with the proposed plan, giving informed consent. The site of surgery properly noted/marked. The patient was taken to Operating Room, identified as Danielle [name] and the procedure verified as Laparoscopic Cholecystectomy with Intraoperative Cholangiograms. A Time Out was held and the  above information confirmed.  Prior to the induction of general anesthesia, antibiotic prophylaxis was administered. General endotracheal anesthesia was then administered and tolerated well. After the induction, the abdomen was prepped in the usual sterile fashion. The patient was positioned in the supine position with the left arm comfortably tucked, along with some reverse Trendelenburg.  Local anesthetic agent was injected into the skin near the umbilicus and an incision made. The midline fascia was incised and the Hasson technique was used to introduce a 12 mm port under direct vision. It was secured with a figure of eight Vicryl suture placed in the usual fashion. Pneumoperitoneum was then created with CO2 and tolerated well without any adverse changes in the patient's vital signs. Additional trocars were introduced under direct vision with an 11 mm trocar in the epigastrium and two  5 mm trocars in the right upper quadrant. All skin incisions were infiltrated with a local anesthetic agent before making the incision and placing the trocars.   The gallbladder was identified, the fundus grasped and retracted cephalad. Adhesions were lysed bluntly and with the electrocautery where indicated, taking care not to injure any adjacent organs or viscus. The infundibulum was grasped and retracted laterally, exposing the peritoneum overlying the triangle of Calot. This was then divided and exposed in a blunt fashion. The cystic duct was clearly identified and bluntly dissected circumferentially. The junctions of the gallbladder, cystic duct and common bile duct were clearly identified prior to the division of any linear structure.   The critical view obtained.  The cystic duct was quite friable and small.  A cholangiogram was not performed.    The cystic duct was then  ligated with surgical clips  on the patient side and  clipped on the gallbladder side and divided. The cystic artery was identified, dissected  free, ligated with clips and divided  as well. Posterior cystic artery clipped and divided.  The gallbladder was dissected from the liver bed in retrograde fashion with the electrocautery. The gallbladder was removed. The liver bed was irrigated and inspected. Hemostasis was achieved with the electrocautery. Copious irrigation was utilized and was repeatedly aspirated until clear all particulate matter. Hemostasis was achieved with no signs  Of bleeding or bile leakage.  Pneumoperitoneum was completely reduced after viewing removal of the trocars under direct vision. The wound was thoroughly irrigated and the fascia was then closed with a figure of eight suture; the skin was then closed with 4 O MONOCRYL and a sterile dressing was applied.  Instrument, sponge, and needle counts were correct at closure and at the conclusion of the case.   Findings: Cholecystitis with Cholelithiasis  Estimated Blood Loss: Minimal         Drains: NONE          Total IV Fluids: see record          Specimens: Gallbladder           Complications: None; patient tolerated the procedure well.         Disposition: PACU - hemodynamically stable.         Condition: stable

## 2019-04-07 ENCOUNTER — Encounter (HOSPITAL_COMMUNITY): Payer: Self-pay | Admitting: Surgery

## 2019-04-07 MED ORDER — OXYCODONE-ACETAMINOPHEN 5-325 MG PO TABS: 1.0000 | ORAL_TABLET | ORAL | 0 refills | Status: DC | PRN

## 2019-04-07 NOTE — Progress Notes (Signed)
AVS given and reviewed with pt. Medications discussed. All questions answered to satisfaction. Pt verbalized understanding of information given. Pt escorted off the unit with all belongings via wheelchair by staff member.  

## 2019-04-07 NOTE — Discharge Instructions (Signed)

## 2019-04-07 NOTE — Plan of Care (Signed)

## 2019-04-07 NOTE — Discharge Summary (Signed)
Physician Discharge Summary  Patient ID: Danielle [name] MRN: [id number] DOB/AGE: 1970-08-29 48 y.o.  PCP: Donald Prose, MD  Admit date: 04/04/2019 Discharge date: 04/07/2019  Admission Diagnoses:  Acute cholecystitis  Discharge Diagnoses:  same  Active Problems:   Cholecystitis   Surgery:  Lap chole per Dr. Brantley Stage  Discharged Condition: improved  Hospital Course:   Had surgery on Saturday and did well and was ready for discharge on Sunday.    Consults: none  Significant Diagnostic Studies: none    Discharge Exam: Blood pressure 121/68, pulse 96, temperature 98.6 F (37 C), temperature source Oral, resp. rate 20, last menstrual period 04/03/2019, SpO2 93 %. Incisions ok  Disposition:    Allergies as of 04/07/2019      Reactions   Septra [bactrim] Nausea And Vomiting      Medication List    STOP taking these medications   fluconazole 150 MG tablet Commonly known as: DIFLUCAN   valACYclovir 500 MG tablet Commonly known as: VALTREX     TAKE these medications   oxyCODONE-acetaminophen 5-325 MG tablet Commonly known as: PERCOCET/ROXICET Take 1 tablet by mouth every 4 (four) hours as needed for moderate pain. What changed:   when to take this  reasons to take this   SUMAtriptan 50 MG tablet Commonly known as: Imitrex Take 1 tablet (50 mg total) by mouth every 2 (two) hours as needed for migraine. May repeat in 2 hours if headache persists or recurs, no more than 2 tablets in 24 hours.      Follow-up [facility], [place]. Schedule an appointment as soon as possible for a visit in 3 week(s).   Specialty: General Surgery Contact information: [address] [place] [phone]          Signed: Pedro Earls 04/07/2019, 9:09 AM

## 2019-04-07 NOTE — Plan of Care (Signed)

## 2019-04-09 LAB — SURGICAL PATHOLOGY

## 2019-05-19 IMAGING — MG DIGITAL DIAGNOSTIC BILATERAL MAMMOGRAM WITH TOMO AND CAD
6 of 12 series · 6 of 36 positions shown · non-contrast
Comparison: Previous exam(s).

CLINICAL DATA: 46-year-old female presenting for delayed follow-up
status post biopsy of a right breast mass which was identified as a
fibroadenoma. The patient was last seen in 2268.

EXAM:
DIGITAL DIAGNOSTIC BILATERAL MAMMOGRAM WITH CAD AND TOMO

[R CC synth-2D]
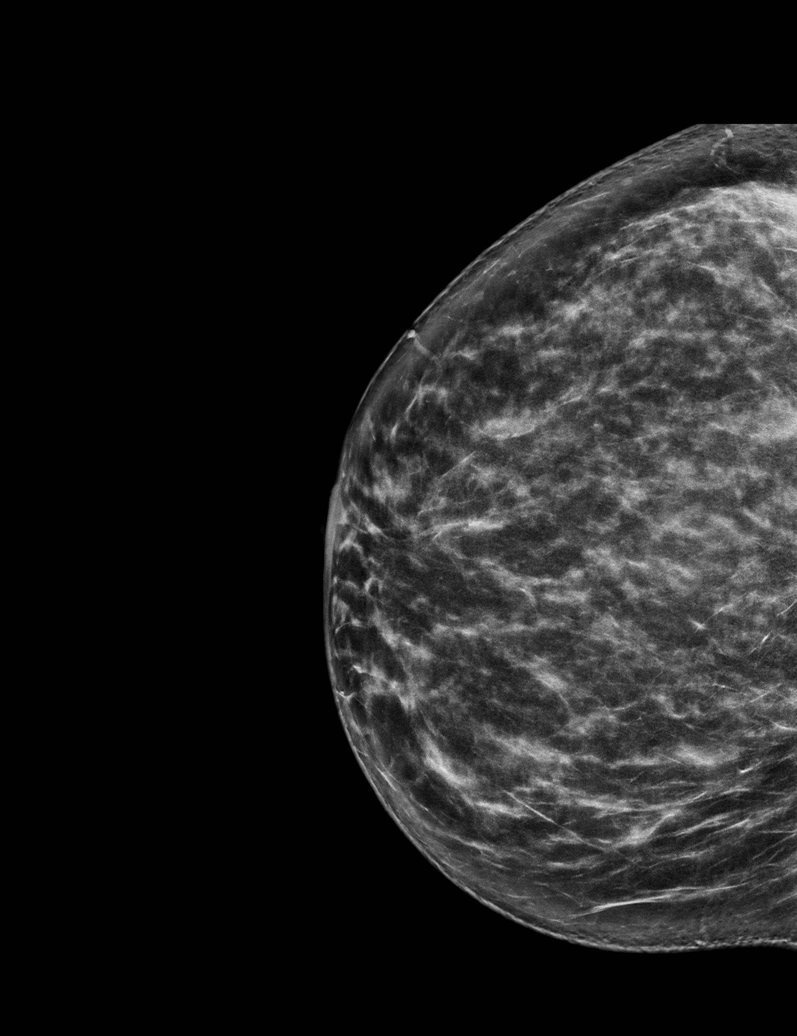

[L CC synth-2D]
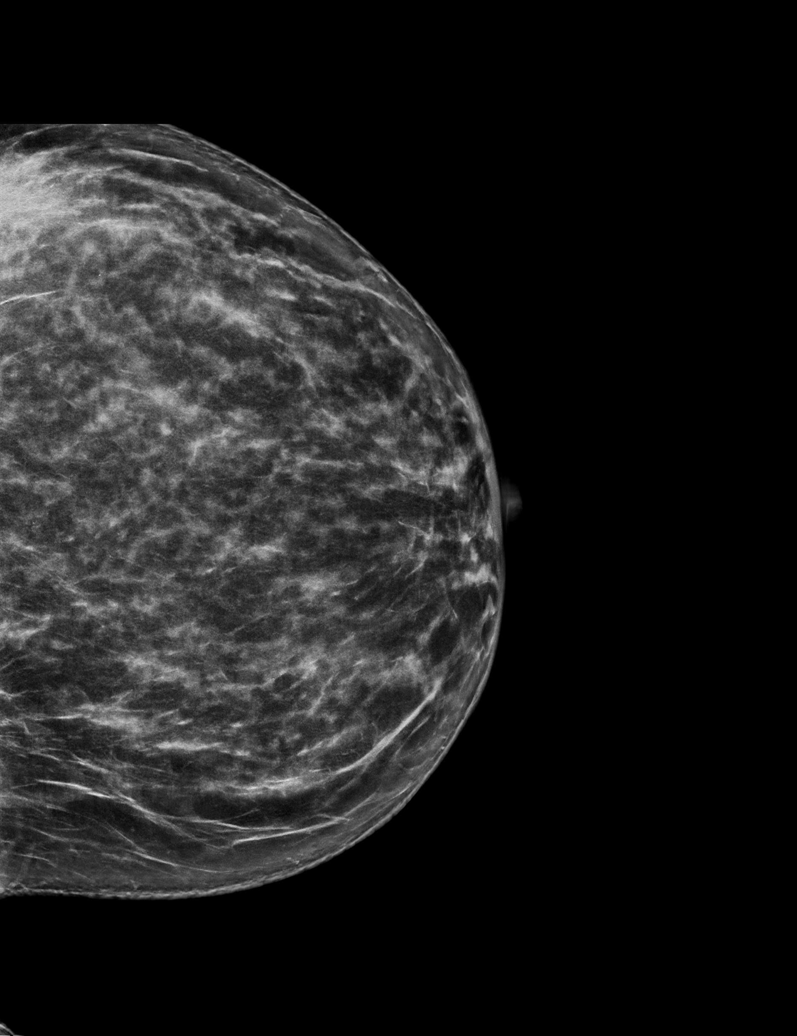

[R MLO synth-2D]
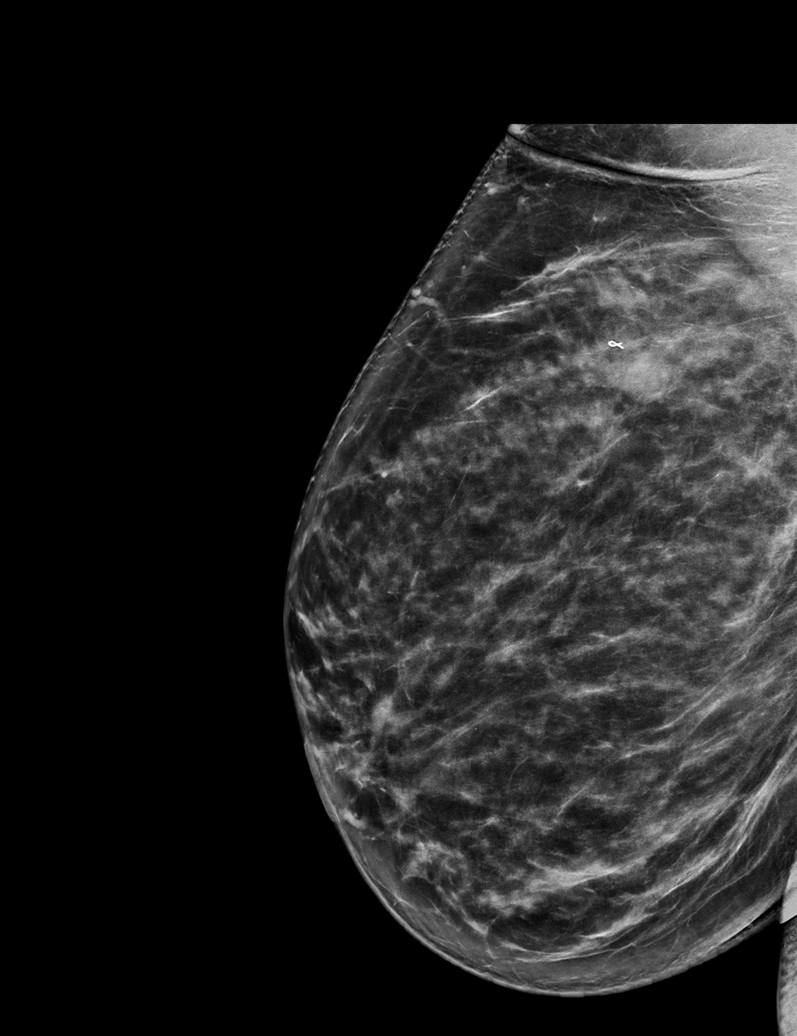

[L XCCL synth-2D]
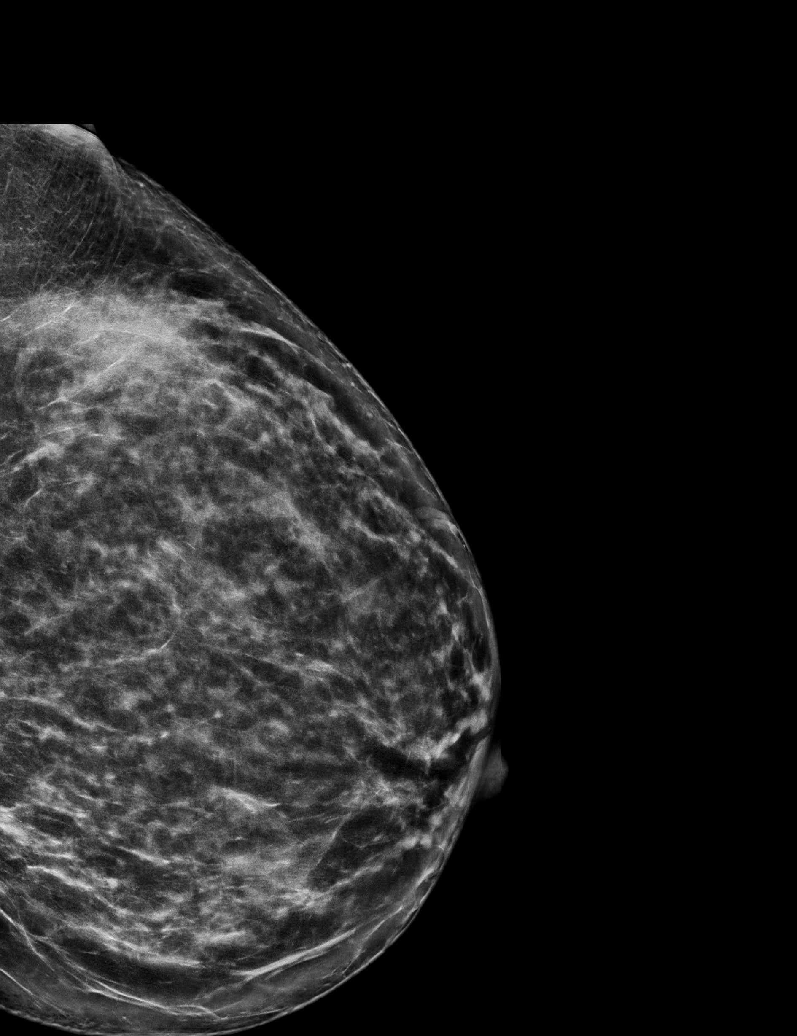

[R XCCL synth-2D]
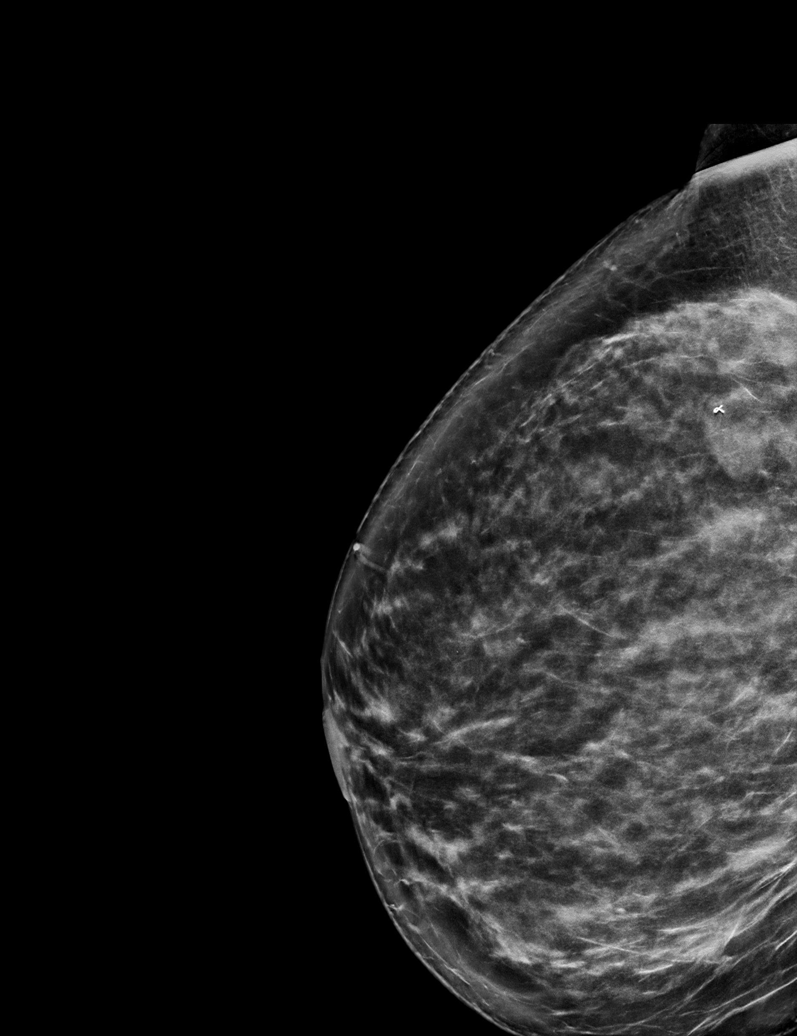

[L MLO synth-2D]
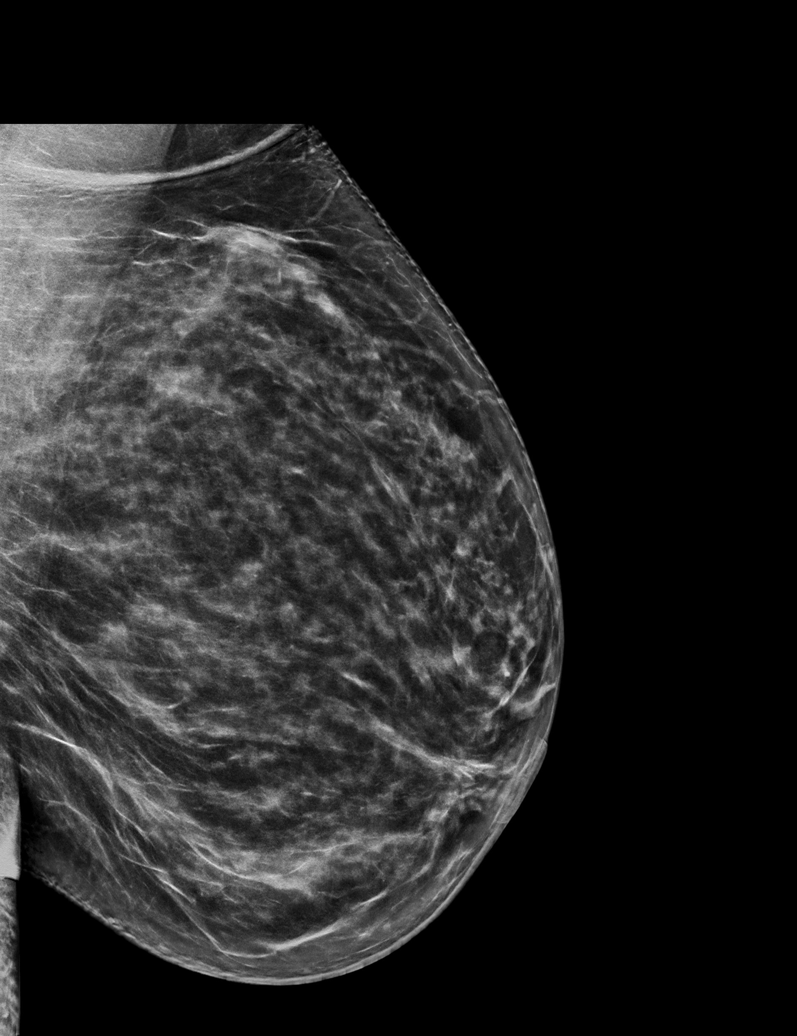

[6 of 36 positions shown; findings below may reference images not displayed]

ACR Breast Density Category c: The breast tissue is heterogeneously
dense, which may obscure small masses.
FINDINGS: The mass in the upper-outer quadrant of the right breast with the
ribbon shaped biopsy marking clip compatible with the biopsied
fibroadenoma is stable. No suspicious calcifications, masses or
areas of distortion are seen in the bilateral breasts.

Mammographic images were processed with CAD.
IMPRESSION: 1. The fibroadenoma in the upper-outer right breast is
mammographically stable.

2.  No mammographic evidence of malignancy in the bilateral breasts.

RECOMMENDATION:
Screening mammogram in one year.(Code:XI-A-Z3S)

I have discussed the findings and recommendations with the patient.
Results were also provided in writing at the conclusion of the
visit. If applicable, a reminder letter will be sent to the patient
regarding the next appointment.

BI-RADS CATEGORY  2: Benign.

## 2019-07-11 ENCOUNTER — Other Ambulatory Visit: Payer: Self-pay

## 2019-07-11 MED ORDER — VALACYCLOVIR HCL 500 MG PO TABS: 500.0000 mg | ORAL_TABLET | Freq: Every day | ORAL | 0 refills | Status: DC

## 2019-07-17 ENCOUNTER — Encounter: Payer: 59 | Admitting: Women's Health

## 2019-08-26 ENCOUNTER — Ambulatory Visit: Payer: 59

## 2019-09-12 ENCOUNTER — Ambulatory Visit: Payer: 59 | Attending: Family

## 2019-09-12 DIAGNOSIS — Z23 Encounter for immunization: Secondary | ICD-10-CM

## 2019-09-12 NOTE — Progress Notes (Signed)
   Covid-19 Vaccination Clinic  Name:  Danielle [name]    MRN: [id number] DOB: [birth year]  09/12/2019  Ms. [name] was observed post Covid-19 immunization for 15 minutes without incident. She was provided with Vaccine Information Sheet and instruction to access the V-Safe system.   Ms. [name] was instructed to call 911 with any severe reactions post vaccine: Marland Kitchen Difficulty breathing  . Swelling of face and throat  . A fast heartbeat  . A bad rash all over body  . Dizziness and weakness   Immunizations Administered    Name Date Dose VIS Date Route   Moderna COVID-19 Vaccine 09/12/2019 12:53 PM 0.5 mL 06/04/2019 Intramuscular   Manufacturer: Moderna   Lot: [id number]   NDC: 71959-747-18

## 2019-10-15 ENCOUNTER — Other Ambulatory Visit: Payer: Self-pay

## 2019-10-15 ENCOUNTER — Ambulatory Visit: Payer: 59 | Attending: Family

## 2019-10-15 DIAGNOSIS — Z23 Encounter for immunization: Secondary | ICD-10-CM

## 2019-10-15 NOTE — Progress Notes (Signed)
   Covid-19 Vaccination Clinic  Name:  DWAN FENNEL    MRN: [id number] DOB: 1971-02-24  10/15/2019  Ms. Sinkler was observed post Covid-19 immunization for 15 minutes without incident. She was provided with Vaccine Information Sheet and instruction to access the V-Safe system.   Ms. Goda was instructed to call 911 with any severe reactions post vaccine: Marland Kitchen Difficulty breathing  . Swelling of face and throat  . A fast heartbeat  . A bad rash all over body  . Dizziness and weakness   Immunizations Administered    Name Date Dose VIS Date Route   Moderna COVID-19 Vaccine 10/15/2019  1:15 PM 0.5 mL 06/04/2019 Intramuscular   Manufacturer: Moderna   Lot: [id number]   NDC: 68257-493-55

## 2019-11-25 ENCOUNTER — Other Ambulatory Visit: Payer: Self-pay

## 2019-11-27 ENCOUNTER — Ambulatory Visit (INDEPENDENT_AMBULATORY_CARE_PROVIDER_SITE_OTHER): Payer: No Typology Code available for payment source | Admitting: Obstetrics and Gynecology

## 2019-11-27 ENCOUNTER — Other Ambulatory Visit: Payer: Self-pay

## 2019-11-27 ENCOUNTER — Encounter: Payer: Self-pay | Admitting: Obstetrics and Gynecology

## 2019-11-27 VITALS — BP 118/78

## 2019-11-27 DIAGNOSIS — A6000 Herpesviral infection of urogenital system, unspecified: Secondary | ICD-10-CM

## 2019-11-27 DIAGNOSIS — N898 Other specified noninflammatory disorders of vagina: Secondary | ICD-10-CM

## 2019-11-27 LAB — WET PREP FOR TRICH, YEAST, CLUE

## 2019-11-27 MED ORDER — VALACYCLOVIR HCL 500 MG PO TABS: 500.0000 mg | ORAL_TABLET | Freq: Every day | ORAL | 1 refills | Status: DC

## 2019-11-27 MED ORDER — FLUCONAZOLE 150 MG PO TABS: 150.0000 mg | ORAL_TABLET | ORAL | 0 refills | Status: AC

## 2019-11-27 NOTE — Progress Notes (Signed)
   MAMMIE MERAS January 09, 1971 [id number]  SUBJECTIVE:  49 y.o. G72P3 female presents for evaluation of white vaginal discharge and itching.  Irregular periods no excessive bleeding, heavier the first 2 days.  Not bleeding in between periods.  Also feels like she has a little bit of pain over the left labia majora, no bumps or lesions that she has noted.  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  She says it has been a very long time since her last herpes symptoms.  Current Outpatient Medications  Medication Sig Dispense Refill  . valACYclovir (VALTREX) 500 MG tablet Take 1 tablet (500 mg total) by mouth daily. (Patient not taking: Reported on 11/27/2019) 180 tablet 0   No current facility-administered medications for this visit.   Allergies: Septra [bactrim]  Patient's last menstrual period was 11/06/2019.  Past medical history,surgical history, problem list, medications, allergies, family history and social history were all reviewed and documented as reviewed in the EPIC chart.  ROS:  Feeling well. No dyspnea or chest pain on exertion.  No abdominal pain, change in bowel habits, black or bloody stools.  No urinary tract symptoms. GYN ROS: As described in HPI   OBJECTIVE:  BP 118/78   LMP 11/06/2019  The patient appears well, alert, oriented x 3, in no distress. PELVIC EXAM: VULVA: normal appearing vulva with no masses, tenderness or lesions, tenderness over left labia majora without any visible abnormality, VAGINA: normal appearing vagina with normal color and discharge, no lesions, CERVIX: normal appearing cervix without discharge or lesions, WET MOUNT done - results: negative for pathogens, normal epithelial cells, many bacteria, few WBC  Chaperone: Kennon Portela present during the examination  ASSESSMENT:  49 y.o. G6P3 here for vaginal candidiasis and symptoms suggestive of recurrent genital HSV  PLAN:  I will refill the Valtrex and recommend  that she take at 1000 mg daily for 5 days since she is probably having prodromal symptoms of an outbreak. Vaginal wet mount is negative, but we will empirically treat for vaginal candidiasis with Diflucan 150 mg by mouth repeat dose in 72 hours. She will let us know if the symptoms do not improve.   Theresia Majors MD 11/27/19

## 2019-11-27 NOTE — Patient Instructions (Signed)
Take 2 tablets (1000 mg) of Valtrex once daily for 5 days, then may take 1 tablet (500 mg) daily for prevention

## 2020-03-04 DIAGNOSIS — Z20828 Contact with and (suspected) exposure to other viral communicable diseases: Secondary | ICD-10-CM | POA: Diagnosis not present

## 2020-04-20 DIAGNOSIS — B373 Candidiasis of vulva and vagina: Secondary | ICD-10-CM | POA: Diagnosis not present

## 2020-04-20 DIAGNOSIS — N898 Other specified noninflammatory disorders of vagina: Secondary | ICD-10-CM | POA: Diagnosis not present

## 2020-04-20 DIAGNOSIS — N760 Acute vaginitis: Secondary | ICD-10-CM | POA: Diagnosis not present

## 2020-05-18 IMAGING — US US ABDOMEN COMPLETE
1 series · 13 of 25 positions shown · non-contrast
Comparison: None.

CLINICAL DATA: Upper abdominal pain

EXAM:
ABDOMEN ULTRASOUND COMPLETE

[Series 1: us abdomen complete · 0.25mm/px · 13 of 91 slices shown]
[im 1/91]
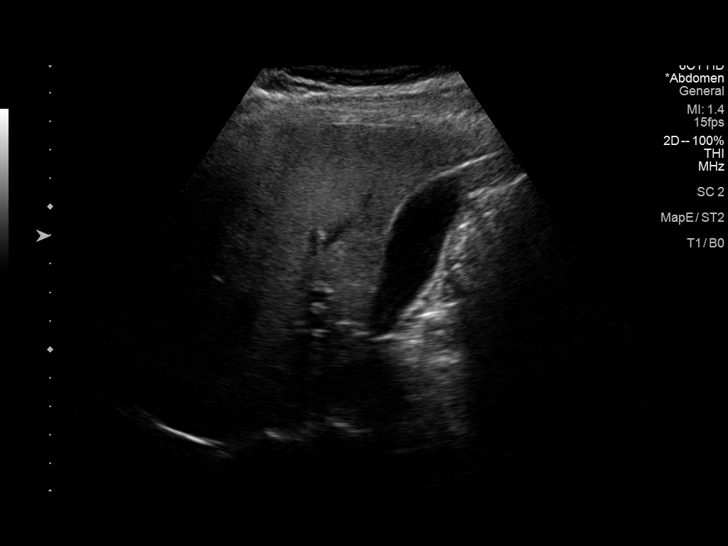
[im 8/91]
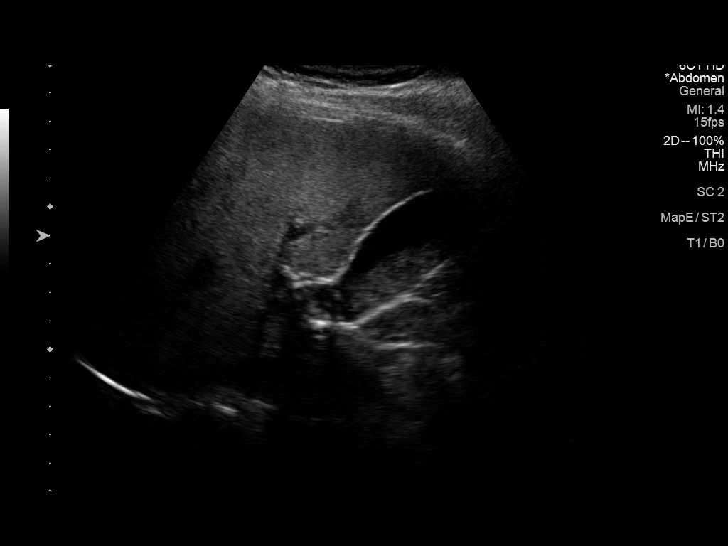
[im 16/91]
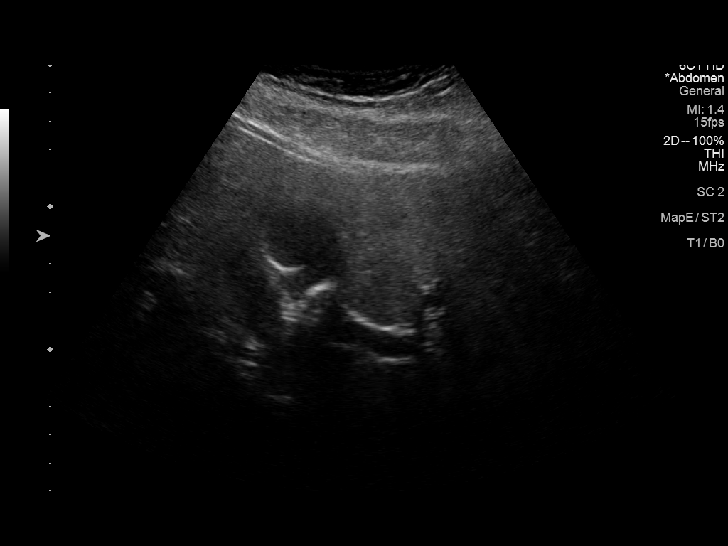
[im 23/91]
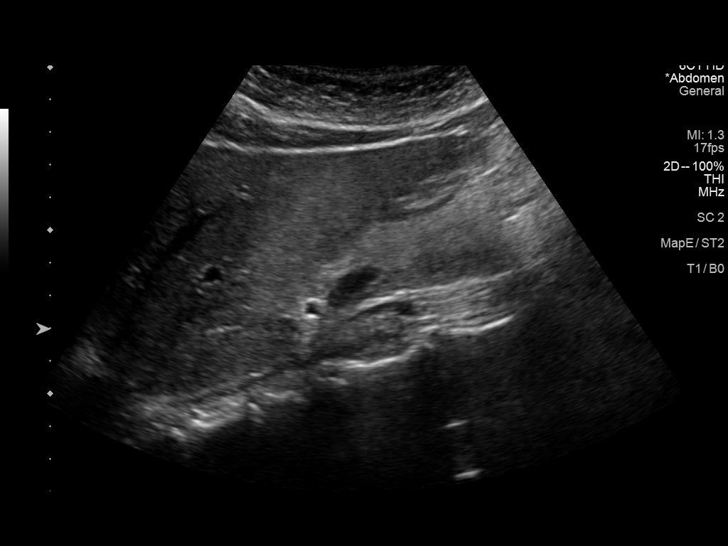
[im 31/91]
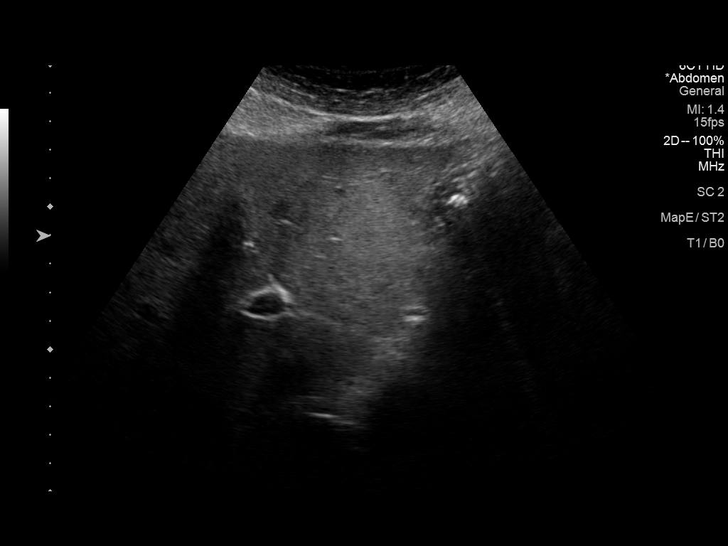
[im 38/91]
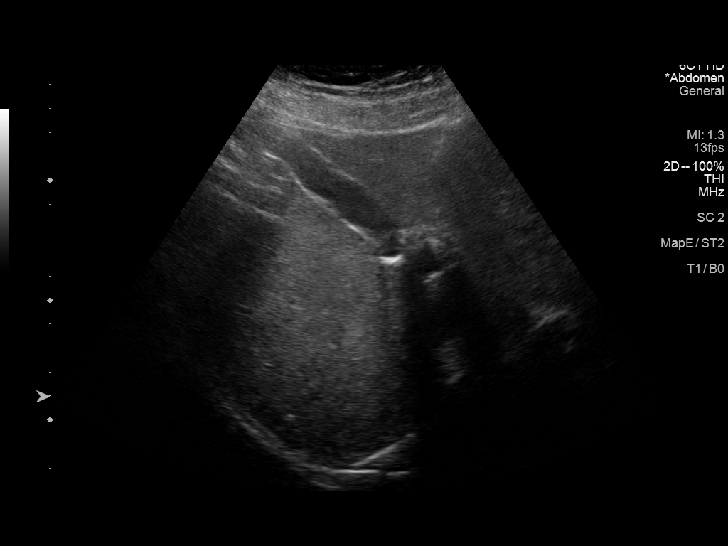
[im 46/91]
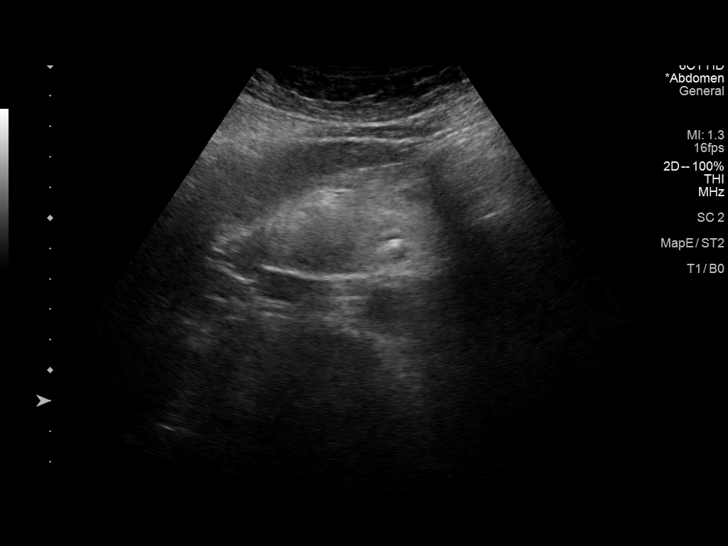
[im 53/91]
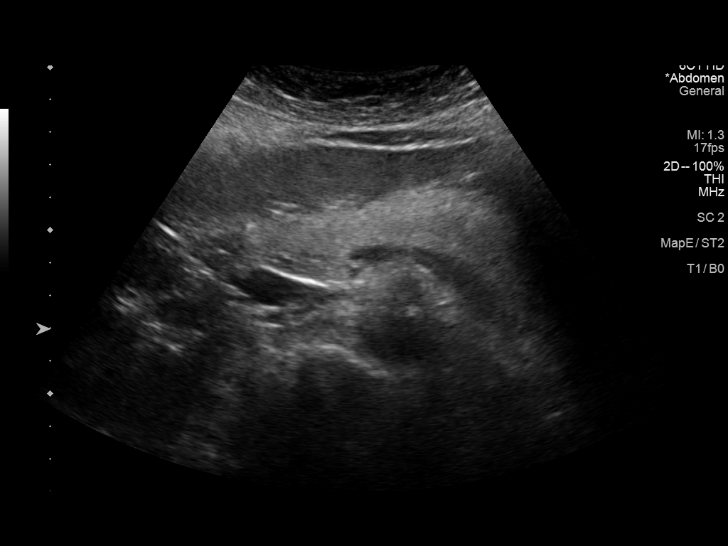
[im 61/91]
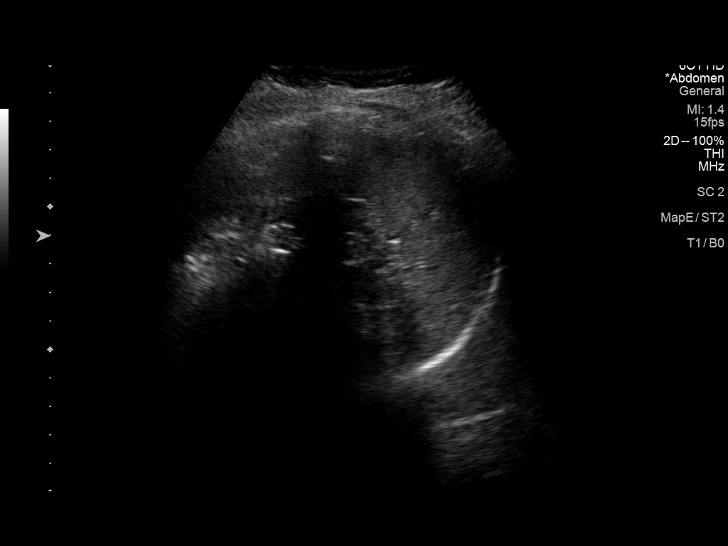
[im 68/91]
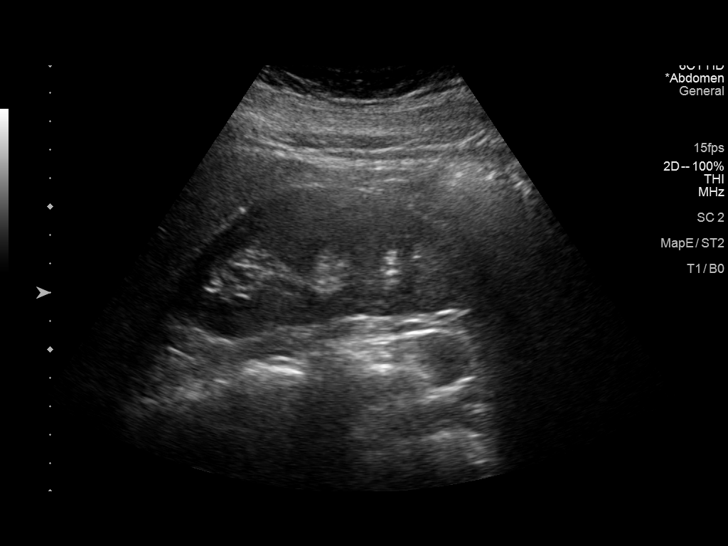
[im 76/91]
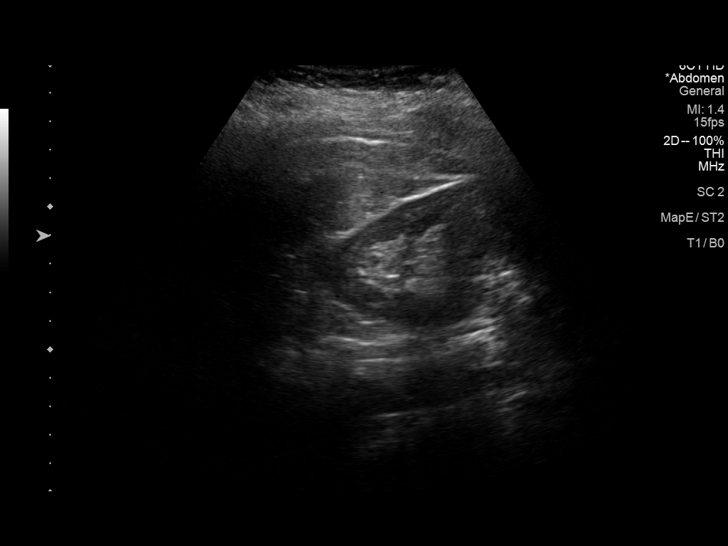
[im 83/91]
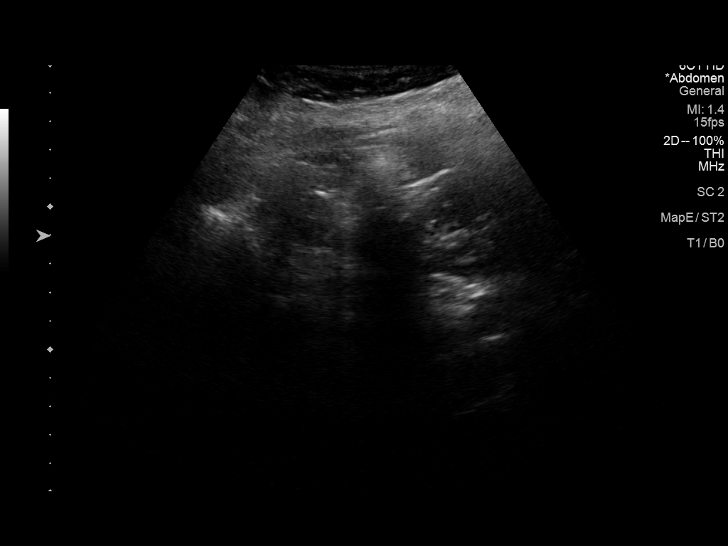
[im 91/91]
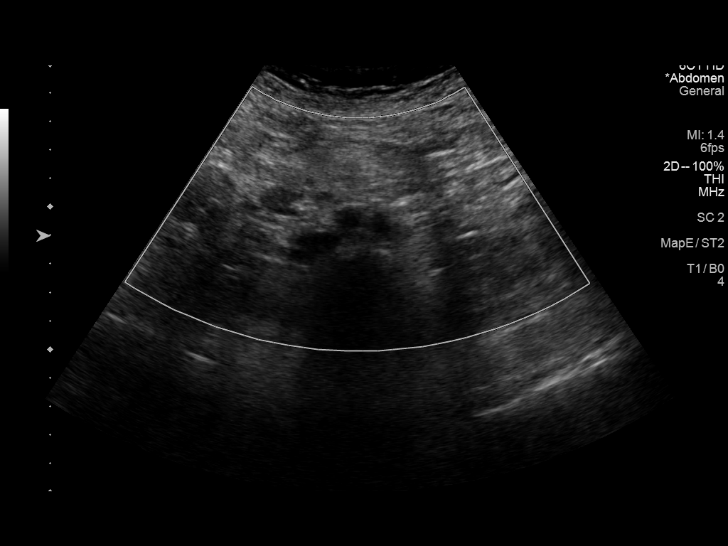

[13 of 25 positions shown; findings below may reference images not displayed]

FINDINGS: Gallbladder: There is a 1.1 cm echogenic focus consistent with a
gallstone adherent in the neck of the gallbladder at the junction
with the cystic duct. There is sludge elsewhere in the gallbladder.
There is no appreciable gallbladder wall thickening or
pericholecystic fluid. Patient is focally tender over the
gallbladder.

Common bile duct: Diameter: 5 mm. No intrahepatic, common hepatic,
or common bile duct dilatation.

Liver: No focal lesion identified. Liver echogenicity overall is
mildly increased with apparent fatty sparing near the gallbladder
fossa. Portal vein is patent on color Doppler imaging with normal
direction of blood flow towards the liver.

IVC: No abnormality visualized.

Pancreas: No pancreatic mass or inflammatory focus.

Spleen: Size and appearance within normal limits.

Right Kidney: Length: 10.4 cm. Echogenicity within normal limits. No
mass or hydronephrosis visualized.

Left Kidney: Length: 10.5 cm. Echogenicity within normal limits. No
mass or hydronephrosis visualized.

Abdominal aorta: No aneurysm visualized.

Other findings: No demonstrable ascites.
IMPRESSION: 1. 1.1 cm in length gallstone adherent in the neck of the
gallbladder at the junction with the cystic duct. Sludge in
gallbladder. Patient is focally tender over the gallbladder. These
are findings concerning for acute cholecystitis.

2. Mild apparent fatty infiltration in the liver. Mild fatty sparing
near the gallbladder fossa.

3.  Study otherwise unremarkable.

These results will be called to the ordering clinician or
representative by the Radiologist Assistant, and communication
documented in the PACS or zVision Dashboard.

## 2020-10-09 DIAGNOSIS — H524 Presbyopia: Secondary | ICD-10-CM | POA: Diagnosis not present

## 2020-10-09 DIAGNOSIS — H00024 Hordeolum internum left upper eyelid: Secondary | ICD-10-CM | POA: Diagnosis not present

## 2020-12-09 DIAGNOSIS — K130 Diseases of lips: Secondary | ICD-10-CM | POA: Diagnosis not present

## 2020-12-09 DIAGNOSIS — Z79899 Other long term (current) drug therapy: Secondary | ICD-10-CM | POA: Diagnosis not present

## 2020-12-09 DIAGNOSIS — L700 Acne vulgaris: Secondary | ICD-10-CM | POA: Diagnosis not present

## 2020-12-09 DIAGNOSIS — L738 Other specified follicular disorders: Secondary | ICD-10-CM | POA: Diagnosis not present

## 2021-01-18 DIAGNOSIS — N852 Hypertrophy of uterus: Secondary | ICD-10-CM | POA: Diagnosis not present

## 2021-01-18 DIAGNOSIS — N8110 Cystocele, unspecified: Secondary | ICD-10-CM | POA: Diagnosis not present

## 2021-01-18 DIAGNOSIS — N939 Abnormal uterine and vaginal bleeding, unspecified: Secondary | ICD-10-CM | POA: Diagnosis not present

## 2021-01-18 DIAGNOSIS — N814 Uterovaginal prolapse, unspecified: Secondary | ICD-10-CM | POA: Diagnosis not present

## 2021-01-18 DIAGNOSIS — Z124 Encounter for screening for malignant neoplasm of cervix: Secondary | ICD-10-CM | POA: Diagnosis not present

## 2021-01-18 DIAGNOSIS — R350 Frequency of micturition: Secondary | ICD-10-CM | POA: Diagnosis not present

## 2021-01-18 DIAGNOSIS — Z01419 Encounter for gynecological examination (general) (routine) without abnormal findings: Secondary | ICD-10-CM | POA: Diagnosis not present

## 2021-02-12 DIAGNOSIS — D259 Leiomyoma of uterus, unspecified: Secondary | ICD-10-CM | POA: Diagnosis not present

## 2021-02-12 DIAGNOSIS — N9489 Other specified conditions associated with female genital organs and menstrual cycle: Secondary | ICD-10-CM | POA: Diagnosis not present

## 2021-02-12 DIAGNOSIS — N939 Abnormal uterine and vaginal bleeding, unspecified: Secondary | ICD-10-CM | POA: Diagnosis not present

## 2021-02-22 DIAGNOSIS — N939 Abnormal uterine and vaginal bleeding, unspecified: Secondary | ICD-10-CM | POA: Diagnosis not present

## 2021-02-22 DIAGNOSIS — B373 Candidiasis of vulva and vagina: Secondary | ICD-10-CM | POA: Diagnosis not present

## 2021-02-22 DIAGNOSIS — Z3202 Encounter for pregnancy test, result negative: Secondary | ICD-10-CM | POA: Diagnosis not present

## 2021-03-12 DIAGNOSIS — N898 Other specified noninflammatory disorders of vagina: Secondary | ICD-10-CM | POA: Diagnosis not present

## 2021-03-12 DIAGNOSIS — A599 Trichomoniasis, unspecified: Secondary | ICD-10-CM | POA: Diagnosis not present

## 2021-03-16 DIAGNOSIS — D219 Benign neoplasm of connective and other soft tissue, unspecified: Secondary | ICD-10-CM | POA: Diagnosis not present

## 2021-03-16 DIAGNOSIS — N939 Abnormal uterine and vaginal bleeding, unspecified: Secondary | ICD-10-CM | POA: Diagnosis not present

## 2021-04-06 DIAGNOSIS — N898 Other specified noninflammatory disorders of vagina: Secondary | ICD-10-CM | POA: Diagnosis not present

## 2021-04-06 DIAGNOSIS — A599 Trichomoniasis, unspecified: Secondary | ICD-10-CM | POA: Diagnosis not present

## 2021-04-06 DIAGNOSIS — L298 Other pruritus: Secondary | ICD-10-CM | POA: Diagnosis not present

## 2021-06-22 ENCOUNTER — Ambulatory Visit (HOSPITAL_BASED_OUTPATIENT_CLINIC_OR_DEPARTMENT_OTHER): Admit: 2021-06-22 | Payer: 59 | Admitting: Obstetrics and Gynecology

## 2021-06-22 ENCOUNTER — Encounter (HOSPITAL_BASED_OUTPATIENT_CLINIC_OR_DEPARTMENT_OTHER): Payer: Self-pay

## 2021-06-22 SURGERY — XI ROBOTIC ASSISTED LAPAROSCOPIC HYSTERECTOMY AND SALPINGECTOMY
Anesthesia: Choice

## 2021-12-29 ENCOUNTER — Other Ambulatory Visit: Payer: Self-pay | Admitting: Family Medicine

## 2021-12-29 DIAGNOSIS — Z1231 Encounter for screening mammogram for malignant neoplasm of breast: Secondary | ICD-10-CM

## 2021-12-31 ENCOUNTER — Ambulatory Visit
Admission: RE | Admit: 2021-12-31 | Discharge: 2021-12-31 | Disposition: A | Discharge status: Home / Self Care | Payer: Self-pay | Source: Ambulatory Visit | Attending: Family Medicine | Admitting: Family Medicine

## 2021-12-31 DIAGNOSIS — Z1231 Encounter for screening mammogram for malignant neoplasm of breast: Secondary | ICD-10-CM | POA: Diagnosis not present

## 2022-01-03 DIAGNOSIS — K130 Diseases of lips: Secondary | ICD-10-CM | POA: Diagnosis not present

## 2022-01-03 DIAGNOSIS — L700 Acne vulgaris: Secondary | ICD-10-CM | POA: Diagnosis not present

## 2022-01-06 ENCOUNTER — Other Ambulatory Visit: Payer: Self-pay | Admitting: Family Medicine

## 2022-01-06 DIAGNOSIS — R928 Other abnormal and inconclusive findings on diagnostic imaging of breast: Secondary | ICD-10-CM

## 2022-01-13 ENCOUNTER — Other Ambulatory Visit: Payer: No Typology Code available for payment source

## 2022-01-17 ENCOUNTER — Other Ambulatory Visit: Payer: Self-pay | Admitting: Family Medicine

## 2022-01-17 ENCOUNTER — Ambulatory Visit
Admission: RE | Admit: 2022-01-17 | Discharge: 2022-01-17 | Disposition: A | Discharge status: Home / Self Care | Payer: No Typology Code available for payment source | Source: Ambulatory Visit | Attending: Family Medicine | Admitting: Family Medicine

## 2022-01-17 DIAGNOSIS — N6312 Unspecified lump in the right breast, upper inner quadrant: Secondary | ICD-10-CM | POA: Diagnosis not present

## 2022-01-17 DIAGNOSIS — R928 Other abnormal and inconclusive findings on diagnostic imaging of breast: Secondary | ICD-10-CM

## 2022-01-17 DIAGNOSIS — N6310 Unspecified lump in the right breast, unspecified quadrant: Secondary | ICD-10-CM

## 2022-01-19 DIAGNOSIS — M940 Chondrocostal junction syndrome [Tietze]: Secondary | ICD-10-CM | POA: Diagnosis not present

## 2022-01-19 DIAGNOSIS — R7303 Prediabetes: Secondary | ICD-10-CM | POA: Diagnosis not present

## 2022-01-19 DIAGNOSIS — Z13220 Encounter for screening for lipoid disorders: Secondary | ICD-10-CM | POA: Diagnosis not present

## 2022-01-19 DIAGNOSIS — Z0000 Encounter for general adult medical examination without abnormal findings: Secondary | ICD-10-CM | POA: Diagnosis not present

## 2022-01-19 DIAGNOSIS — Z1211 Encounter for screening for malignant neoplasm of colon: Secondary | ICD-10-CM | POA: Diagnosis not present

## 2022-01-21 ENCOUNTER — Ambulatory Visit
Admission: RE | Admit: 2022-01-21 | Discharge: 2022-01-21 | Disposition: A | Discharge status: Home / Self Care | Payer: No Typology Code available for payment source | Source: Ambulatory Visit | Attending: Family Medicine | Admitting: Family Medicine

## 2022-01-21 DIAGNOSIS — N6312 Unspecified lump in the right breast, upper inner quadrant: Secondary | ICD-10-CM | POA: Diagnosis not present

## 2022-01-21 DIAGNOSIS — D241 Benign neoplasm of right breast: Secondary | ICD-10-CM | POA: Diagnosis not present

## 2022-01-21 DIAGNOSIS — N6310 Unspecified lump in the right breast, unspecified quadrant: Secondary | ICD-10-CM

## 2022-01-21 HISTORY — PX: BREAST BIOPSY: SHX20

## 2022-01-21 HISTORY — DX: Benign neoplasm of right breast: D24.1

## 2022-06-02 DIAGNOSIS — H524 Presbyopia: Secondary | ICD-10-CM | POA: Diagnosis not present

## 2022-08-18 DIAGNOSIS — N898 Other specified noninflammatory disorders of vagina: Secondary | ICD-10-CM | POA: Diagnosis not present

## 2022-08-18 DIAGNOSIS — R030 Elevated blood-pressure reading, without diagnosis of hypertension: Secondary | ICD-10-CM | POA: Diagnosis not present

## 2022-08-18 DIAGNOSIS — Z86018 Personal history of other benign neoplasm: Secondary | ICD-10-CM | POA: Diagnosis not present

## 2022-08-18 DIAGNOSIS — R102 Pelvic and perineal pain: Secondary | ICD-10-CM | POA: Diagnosis not present

## 2022-08-18 DIAGNOSIS — R350 Frequency of micturition: Secondary | ICD-10-CM | POA: Diagnosis not present

## 2022-08-31 DIAGNOSIS — R102 Pelvic and perineal pain: Secondary | ICD-10-CM | POA: Diagnosis not present

## 2022-08-31 DIAGNOSIS — D259 Leiomyoma of uterus, unspecified: Secondary | ICD-10-CM | POA: Diagnosis not present

## 2022-10-24 NOTE — H&P (Signed)
Danielle [name] is an 52 y.o. G3P3 who is admitted for Robotic Assisted Total Laparoscopic Hysterectomy and Bilateral Salpingectomy for symptomatic uterine fibroids (pelvic pain/pressure) and AUB (heavy menses due to fibroids).  Patient has had 2 year history of AUB-L and desires definitive management via hysterectomy now. Desires ovarian preservation.  Work-up: Pap smear (01/18/2021): NILM/HRHPV negative  EMB (02/22/2021): Secretory endometrium, no hyperplasia or carcinoma  TVUS (08/31/22): Uterus 13.27 x 9.70 x 8.65cm Endometrial thickness 0.45cm Fibroid 1: 9.51cm  Fibroid 2: 2.92cm R ovary 2.41cm  L ovary 3.18cm Anteverted uterus. Uterine fibroids - posterior right 9.5 x 9.2 x 7.4cm, fundal 2.9 x 2.8 x 2.7cm, fibroids increased in size since previous US in 2022. Endometrium wnl. Bilateral ovaries wnl. No adnexal masses seen.  Pelvic US (02/12/21):        Uterus 13.06 x 8.15 x 8.34 cm        Endometrial thickness 1.28 cm        Fibroid 1 7.17 cm        Right ovary 2.99 cm  Left ovary 3.21 cm        Comments: Pt scanned abdominally only due to size of fibroid. Uterus enlarged. Fibroid- right lateral 7.2 x 7.5 x 7.7 cm.       Endometrium thickened with echogenic mass within- 1.2 x 0.8 x 1.0 cm, avascular. Ovaries seen-WNL. No adnexal masses  Patient Active Problem List   Diagnosis Date Noted   Cholecystitis 04/05/2019   H/O tubal ligation 05/05/2015   HSV (herpes simplex virus) anogenital infection 01/28/2013    MEDICAL/FAMILY/SOCIAL HX: No LMP recorded.    Past Medical History:  Diagnosis Date   Bacterial vaginosis    RECURRENT   HSV (herpes simplex virus) anogenital infection    Migraines     Past Surgical History:  Procedure Laterality Date   CESAREAN SECTION  12/26/2000   CHOLECYSTECTOMY N/A 04/06/2019   Procedure: LAPAROSCOPIC CHOLECYSTECTOMY;  Surgeon: Harriette Bouillon, MD;  Location: [facility] OR;  Service: General;  Laterality: N/A;   CYST EXCISION N/A 04/21/2017    Procedure: EXCISION OF SEBACEOUS CYST OF BILATERAL THIGHS AND LEFT LABIA;  Surgeon: Berna Bue, MD;  Location: [facility];  Service: General;  Laterality: N/A;   INCISE AND DRAIN ABCESS  [phone]   LEFT LABIAL-SKENE    TUBAL LIGATION  07/04/2007    Family History  Problem Relation Age of Onset   Diabetes Mother    Hypertension Mother    Diabetes Father     Social History:  reports that she has never smoked. She has never used smokeless tobacco. She reports that she does not drink alcohol and does not use drugs.  ALLERGIES/MEDS:  Allergies:  Allergies  Allergen Reactions   Septra [Bactrim] Nausea And Vomiting    No medications prior to admission.     Review of Systems  Constitutional: Negative.   HENT: Negative.    Eyes: Negative.   Respiratory: Negative.    Cardiovascular: Negative.   Gastrointestinal: Negative.   Genitourinary: Negative.   Musculoskeletal: Negative.   Skin: Negative.   Neurological: Negative.   Endo/Heme/Allergies: Negative.   Psychiatric/Behavioral: Negative.      There were no vitals taken for this visit. Gen:  NAD, pleasant and cooperative Cardio:  RRR Pulm:  CTAB, no wheezes/rales/rhonchi Abd:  Soft, non-distended, non-tender throughout, no rebound/guarding Ext:  No bilateral LE edema, no bilateral calf tenderness Pelvic: Labia - unremarkable, vagina - pink moist mucosa, no lesions or abnormal discharge, cervix -  no discharge or lesions or CMT, adnexa - no masses or tenderness - uterus non-tender and normal size on palpation  No results found for this or any previous visit (from the past 24 hour(s)).  No results found.   ASSESSMENT/PLAN: Danielle [name] is a 52 y.o. G3P3 who is admitted for Robotic Assisted Total Laparoscopic Hysterectomy and Bilateral Salpingectomy for symptomatic uterine fibroids (pelvic pain/pressure) and AUB (heavy menses due to fibroids).  - Admit to [facility] - Admit labs (CBC, T&S) - Diet:  Per  anesthesia/ERAS pathway - IVF:  Per anesthesia - VTE Prophylaxis:  SCDs - Antibiotics: Ancef 2g on call to OR - D/C home same-day or POD#1  Consents: I have explained to the patient that his surgery is performed to remove the uterus through several small incisions in the abdomen and that it will result in sterility.  I discussed the risks and benefits of the surgery, including, but not limited to bleeding, including the need for blood transfusion, infection, damage to surround organs and tissues, damage to bladder, damage to ureters, causing kidney damage, and requiring additional procedures, damage to bowels, resulting in further surgery, postoperative pain, short-term and long-term, scarring on the abdominal wall and intra-abdominally, need for further surgery, need for conversion to an open procedure, development of an incisional hernia, deep vein thrombosis, and/or pulmonary embolism, wound infection and/or separation, painful intercourse, urinary leakage, ovarian failure, resulting in menopausal symptoms requiring treatment, fistula formation, complications the course of which cannot be predicted or prevented, and death. Reviewed risks/benefits of ovarian preservation vs removal - counseled regarding possible future need for surgery on the ovaries and possible 1:70 risk of ovarian cancer. Patient opts for ovarian preservation.Reviewed risks/benefits of uterine morcellation were discussed. We discussed low risk for spread of malignancy if malignancy were present. Patient understands this risk is rare and is open to uterine morcellation if necessary. Patient was consented for blood products.  The patient is aware that bleeding may result in the need for a blood transfusion which includes risk of transmission of HIV (1:2 million), Hepatitis C (1:2 million), and Hepatitis B (1:200 thousand) and transfusion reaction.  Patient voiced understanding of the above risks as well as understanding of indications  for blood transfusion.    [name], DO

## 2022-10-25 ENCOUNTER — Encounter (HOSPITAL_BASED_OUTPATIENT_CLINIC_OR_DEPARTMENT_OTHER): Payer: Self-pay | Admitting: Obstetrics and Gynecology

## 2022-10-26 ENCOUNTER — Encounter (HOSPITAL_BASED_OUTPATIENT_CLINIC_OR_DEPARTMENT_OTHER): Payer: Self-pay | Admitting: Obstetrics and Gynecology

## 2022-10-26 ENCOUNTER — Other Ambulatory Visit: Payer: Self-pay

## 2022-10-26 DIAGNOSIS — Z01812 Encounter for preprocedural laboratory examination: Secondary | ICD-10-CM | POA: Diagnosis not present

## 2022-10-26 NOTE — Progress Notes (Signed)
Your procedure is scheduled on Wednesday, 11/09/2022.  Report to [facility] AT  9:30 AM.   Call this number if you have problems the morning of surgery  [phone].   OUR ADDRESS IS [address].  [facility] ARE LOCATED IN [facility].  PLEASE BRING YOUR INSURANCE CARD AND PHOTO ID DAY OF SURGERY.  ONLY 2 PEOPLE ARE ALLOWED IN  WAITING  ROOM                                      REMEMBER:  DO NOT EAT FOOD, CANDY GUM OR MINTS  AFTER MIDNIGHT THE NIGHT BEFORE YOUR SURGERY . YOU MAY HAVE CLEAR LIQUIDS FROM MIDNIGHT THE NIGHT BEFORE YOUR SURGERY UNTIL  8:30 AM. NO CLEAR LIQUIDS AFTER  8:30 AM DAY OF SURGERY.  YOU MAY  BRUSH YOUR TEETH MORNING OF SURGERY AND RINSE YOUR MOUTH OUT, NO CHEWING GUM CANDY OR MINTS.     CLEAR LIQUID DIET    Allowed      Water                                                                   Coffee and tea, regular and decaf  (NO cream or milk products of any type, may sweeten)                         Carbonated beverages, regular and diet                                    Sports drinks like Gatorade _____________________________________________________________________     TAKE ONLY THESE MEDICATIONS MORNING OF SURGERY:  NONE                                        DO NOT WEAR JEWERLY/  METAL/  PIERCINGS (INCLUDING NO PLASTIC PIERCINGS) DO NOT WEAR LOTIONS, POWDERS, PERFUMES OR NAIL POLISH ON YOUR FINGERNAILS. TOENAIL POLISH IS OK TO WEAR. DO NOT SHAVE FOR 48 HOURS PRIOR TO DAY OF SURGERY.  CONTACTS, GLASSES, OR DENTURES MAY NOT BE WORN TO SURGERY.  REMEMBER: NO SMOKING, VAPING ,  DRUGS OR ALCOHOL FOR 24 HOURS BEFORE YOUR SURGERY.                                    [facility] IS NOT RESPONSIBLE  FOR ANY BELONGINGS.                                                                    Marland Kitchen           [facility] - Preparing for Surgery Before surgery,  you can play an important role.  Because skin is not sterile,  your skin needs to be as free of germs as possible.  You can reduce the number of germs on your skin by washing with CHG (chlorahexidine gluconate) soap before surgery.  CHG is an antiseptic cleaner which kills germs and bonds with the skin to continue killing germs even after washing. Please DO NOT use if you have an allergy to CHG or antibacterial soaps.  If your skin becomes reddened/irritated stop using the CHG and inform your nurse when you arrive at Short Stay. Do not shave (including legs and underarms) for at least 48 hours prior to the first CHG shower.  You may shave your face/neck. Please follow these instructions carefully:  1.  Shower with CHG Soap the night before surgery and the  morning of Surgery.  2.  If you choose to wash your hair, wash your hair first as usual with your  normal  shampoo.  3.  After you shampoo, rinse your hair and body thoroughly to remove the  shampoo.                                        4.  Use CHG as you would any other liquid soap.  You can apply chg directly  to the skin and wash , chg soap provided, night before and morning of your surgery.  5.  Apply the CHG Soap to your body ONLY FROM THE NECK DOWN.   Do not use on face/ open                           Wound or open sores. Avoid contact with eyes, ears mouth and genitals (private parts).                       Wash face,  Genitals (private parts) with your normal soap.             6.  Wash thoroughly, paying special attention to the area where your surgery  will be performed.  7.  Thoroughly rinse your body with warm water from the neck down.  8.  DO NOT shower/wash with your normal soap after using and rinsing off  the CHG Soap.             9.  Pat yourself dry with a clean towel.            10.  Wear clean pajamas.            11.  Place clean sheets on your bed the night of your first shower and do not  sleep with pets. Day of Surgery : Do not apply any lotions/ powders the morning of surgery.   Please wear clean clothes to the hospital/surgery center.  IF YOU HAVE ANY SKIN IRRITATION OR PROBLEMS WITH THE SURGICAL SOAP, PLEASE GET A BAR OF GOLD DIAL SOAP AND SHOWER THE NIGHT BEFORE YOUR SURGERY AND THE MORNING OF YOUR SURGERY. PLEASE LET THE NURSE KNOW MORNING OF YOUR SURGERY IF YOU HAD ANY PROBLEMS WITH THE SURGICAL SOAP.   YOUR SURGEON MAY HAVE REQUESTED EXTENDED RECOVERY TIME AFTER YOUR SURGERY. IT COULD BE A  JUST A FEW HOURS  UP TO AN OVERNIGHT STAY.  YOUR SURGEON SHOULD HAVE DISCUSSED THIS WITH YOU PRIOR TO YOUR SURGERY.  IN THE EVENT YOU NEED TO STAY OVERNIGHT PLEASE REFER TO THE FOLLOWING GUIDELINES. YOU MAY HAVE UP TO 4 VISITORS  MAY VISIT IN THE EXTENDED RECOVERY ROOM UNTIL 800 PM ONLY.  ONE  VISITOR AGE 42 AND OVER MAY SPEND THE NIGHT AND MUST BE IN EXTENDED RECOVERY ROOM NO LATER THAN 800 PM . YOUR DISCHARGE TIME AFTER YOU SPEND THE NIGHT IS 900 AM THE MORNING AFTER YOUR SURGERY. YOU MAY PACK A SMALL OVERNIGHT BAG WITH TOILETRIES FOR YOUR OVERNIGHT STAY IF YOU WISH.  REGARDLESS OF IF YOU STAY OVER NIGHT OR ARE DISCHARGED THE SAME DAY YOU WILL BE REQUIRED TO HAVE A RESPONSIBLE ADULT (18 YRS OLD OR OLDER) STAY WITH YOU FOR AT LEAST THE FIRST 24 HOURS  YOUR PRESCRIPTION MEDICATIONS WILL BE PROVIDED DURING YOUR HOSPITAL STAY.  ________________________________________________________________________                                                        QUESTIONS Danielle [name] PRE OP NURSE PHONE [phone].

## 2022-10-26 NOTE — Progress Notes (Signed)
Spoke w/ via phone for pre-op interview---Danielle [name] needs dos----urine pregnancy               [name] results------11/02/22 [name] appt for cbc, type & screen COVID test -----patient states asymptomatic no test needed Arrive at -------0930 on Wednesday, 11/09/22 NPO after MN NO Solid Food.  Clear liquids from MN until---0830 Med rec completed Medications to take morning of surgery -----none Diabetic medication -----n/a Patient instructed no nail polish to be worn day of surgery Patient instructed to bring photo id and insurance card day of surgery Patient aware to have Driver (ride ) / caregiver    for 24 hours after surgery - Danielle [name] Patient Special Instructions -----Extended / overnight stay instructions given. Pre-Op special Instructions -----none Patient verbalized understanding of instructions that were given at this phone interview. Patient denies shortness of breath, chest pain, fever, cough at this phone interview.

## 2022-10-31 DIAGNOSIS — Z01818 Encounter for other preprocedural examination: Secondary | ICD-10-CM | POA: Diagnosis not present

## 2022-10-31 DIAGNOSIS — R102 Pelvic and perineal pain: Secondary | ICD-10-CM | POA: Diagnosis not present

## 2022-10-31 DIAGNOSIS — N939 Abnormal uterine and vaginal bleeding, unspecified: Secondary | ICD-10-CM | POA: Diagnosis not present

## 2022-11-02 ENCOUNTER — Encounter (HOSPITAL_COMMUNITY)
Admission: RE | Admit: 2022-11-02 | Discharge: 2022-11-02 | Disposition: A | Discharge status: Home / Self Care | Payer: No Typology Code available for payment source | Source: Ambulatory Visit | Attending: Obstetrics and Gynecology | Admitting: Obstetrics and Gynecology

## 2022-11-02 ENCOUNTER — Encounter (HOSPITAL_BASED_OUTPATIENT_CLINIC_OR_DEPARTMENT_OTHER): Payer: Self-pay | Admitting: Obstetrics and Gynecology

## 2022-11-02 DIAGNOSIS — Z01818 Encounter for other preprocedural examination: Secondary | ICD-10-CM

## 2022-11-02 DIAGNOSIS — Z01812 Encounter for preprocedural laboratory examination: Secondary | ICD-10-CM | POA: Diagnosis not present

## 2022-11-02 LAB — CBC
HCT: 37.1 % (ref 36.0–46.0)
Hemoglobin: 11.6 g/dL — ABNORMAL LOW (ref 12.0–15.0)
MCH: 26.2 pg (ref 26.0–34.0)
MCHC: 31.3 g/dL (ref 30.0–36.0)
MCV: 83.9 fL (ref 80.0–100.0)
Platelets: 422 10*3/uL — ABNORMAL HIGH (ref 150–400)
RBC: 4.42 MIL/uL (ref 3.87–5.11)
RDW: 15.1 % (ref 11.5–15.5)
WBC: 6.8 10*3/uL (ref 4.0–10.5)
nRBC: 0 % (ref 0.0–0.2)

## 2022-11-02 LAB — TYPE AND SCREEN: ABO/RH(D): AB POS

## 2022-11-09 ENCOUNTER — Encounter (HOSPITAL_BASED_OUTPATIENT_CLINIC_OR_DEPARTMENT_OTHER)
Admission: RE | Disposition: A | Discharge status: Home / Self Care | Payer: Self-pay | Source: Ambulatory Visit | Attending: Obstetrics and Gynecology

## 2022-11-09 ENCOUNTER — Encounter (HOSPITAL_BASED_OUTPATIENT_CLINIC_OR_DEPARTMENT_OTHER): Payer: Self-pay | Admitting: Obstetrics and Gynecology

## 2022-11-09 ENCOUNTER — Ambulatory Visit (HOSPITAL_BASED_OUTPATIENT_CLINIC_OR_DEPARTMENT_OTHER): Payer: No Typology Code available for payment source | Admitting: Anesthesiology

## 2022-11-09 ENCOUNTER — Ambulatory Visit (HOSPITAL_BASED_OUTPATIENT_CLINIC_OR_DEPARTMENT_OTHER)
Admission: RE | Admit: 2022-11-09 | Discharge: 2022-11-10 | Disposition: A | Discharge status: Home / Self Care | Payer: No Typology Code available for payment source | Source: Ambulatory Visit | Attending: Obstetrics and Gynecology | Admitting: Obstetrics and Gynecology

## 2022-11-09 ENCOUNTER — Other Ambulatory Visit: Payer: Self-pay

## 2022-11-09 DIAGNOSIS — N8003 Adenomyosis of the uterus: Secondary | ICD-10-CM | POA: Insufficient documentation

## 2022-11-09 DIAGNOSIS — D259 Leiomyoma of uterus, unspecified: Secondary | ICD-10-CM | POA: Diagnosis not present

## 2022-11-09 DIAGNOSIS — D251 Intramural leiomyoma of uterus: Secondary | ICD-10-CM | POA: Insufficient documentation

## 2022-11-09 DIAGNOSIS — N838 Other noninflammatory disorders of ovary, fallopian tube and broad ligament: Secondary | ICD-10-CM | POA: Insufficient documentation

## 2022-11-09 DIAGNOSIS — N920 Excessive and frequent menstruation with regular cycle: Secondary | ICD-10-CM | POA: Diagnosis not present

## 2022-11-09 DIAGNOSIS — N3289 Other specified disorders of bladder: Secondary | ICD-10-CM | POA: Insufficient documentation

## 2022-11-09 DIAGNOSIS — D219 Benign neoplasm of connective and other soft tissue, unspecified: Secondary | ICD-10-CM

## 2022-11-09 DIAGNOSIS — Z01818 Encounter for other preprocedural examination: Secondary | ICD-10-CM

## 2022-11-09 DIAGNOSIS — N939 Abnormal uterine and vaginal bleeding, unspecified: Secondary | ICD-10-CM | POA: Insufficient documentation

## 2022-11-09 DIAGNOSIS — S3729XA Other injury of bladder, initial encounter: Secondary | ICD-10-CM | POA: Diagnosis not present

## 2022-11-09 HISTORY — DX: Presence of spectacles and contact lenses: Z97.3

## 2022-11-09 HISTORY — DX: Other specified health status: Z78.9

## 2022-11-09 HISTORY — PX: ROBOTIC ASSISTED LAPAROSCOPIC HYSTERECTOMY AND SALPINGECTOMY: SHX6379

## 2022-11-09 HISTORY — PX: LAPAROTOMY: SHX154

## 2022-11-09 HISTORY — PX: CYSTOSCOPY: SHX5120

## 2022-11-09 HISTORY — DX: Prediabetes: R73.03

## 2022-11-09 LAB — TYPE AND SCREEN: Antibody Screen: NEGATIVE

## 2022-11-09 LAB — ABO/RH: ABO/RH(D): AB POS

## 2022-11-09 LAB — POCT PREGNANCY, URINE: Preg Test, Ur: NEGATIVE

## 2022-11-09 SURGERY — XI ROBOTIC ASSISTED LAPAROSCOPIC HYSTERECTOMY AND SALPINGECTOMY
Anesthesia: General | Site: Bladder | Laterality: Bilateral

## 2022-11-09 MED ORDER — OXYCODONE HCL 5 MG/5ML PO SOLN: 5.0000 mg | Freq: Once | ORAL | Status: DC | PRN

## 2022-11-09 MED ORDER — SIMETHICONE 80 MG PO CHEW: 80.0000 mg | CHEWABLE_TABLET | Freq: Four times a day (QID) | ORAL | Status: DC | PRN

## 2022-11-09 MED ORDER — DOCUSATE SODIUM 100 MG PO CAPS
ORAL_CAPSULE | ORAL | Status: AC
  Filled 2022-11-09: qty 1

## 2022-11-09 MED ORDER — LACTATED RINGERS IV SOLN: INTRAVENOUS | Status: DC

## 2022-11-09 MED ORDER — KETOROLAC TROMETHAMINE 30 MG/ML IJ SOLN
INTRAMUSCULAR | Status: DC | PRN
  Administered 2022-11-09: 30 mg via INTRAVENOUS

## 2022-11-09 MED ORDER — MORPHINE SULFATE (PF) 2 MG/ML IV SOLN
INTRAVENOUS | Status: AC
  Filled 2022-11-09: qty 1

## 2022-11-09 MED ORDER — ONDANSETRON HCL 4 MG/2ML IJ SOLN: 4.0000 mg | Freq: Four times a day (QID) | INTRAMUSCULAR | Status: DC | PRN

## 2022-11-09 MED ORDER — OXYCODONE HCL 5 MG PO TABS
ORAL_TABLET | ORAL | Status: AC
  Filled 2022-11-09: qty 2

## 2022-11-09 MED ORDER — ACETAMINOPHEN 500 MG PO TABS
ORAL_TABLET | ORAL | Status: AC
  Filled 2022-11-09: qty 2

## 2022-11-09 MED ORDER — HEMOSTATIC AGENTS (NO CHARGE) OPTIME
TOPICAL | Status: DC | PRN
  Administered 2022-11-09: 1

## 2022-11-09 MED ORDER — IBUPROFEN 800 MG PO TABS: 800.0000 mg | ORAL_TABLET | Freq: Three times a day (TID) | ORAL | 1 refills | Status: AC | PRN

## 2022-11-09 MED ORDER — SODIUM CHLORIDE 0.9 % IV SOLN
INTRAVENOUS | Status: DC | PRN
  Administered 2022-11-09: 100 mL

## 2022-11-09 MED ORDER — FENTANYL CITRATE (PF) 100 MCG/2ML IJ SOLN
25.0000 ug | INTRAMUSCULAR | Status: DC | PRN
  Administered 2022-11-09: 25 ug via INTRAVENOUS

## 2022-11-09 MED ORDER — HYDROMORPHONE HCL 1 MG/ML IJ SOLN
INTRAMUSCULAR | Status: DC | PRN
  Administered 2022-11-09: .5 mg via INTRAVENOUS
  Administered 2022-11-09: 1 mg via INTRAVENOUS
  Administered 2022-11-09: .5 mg via INTRAVENOUS

## 2022-11-09 MED ORDER — 0.9 % SODIUM CHLORIDE (POUR BTL) OPTIME
TOPICAL | Status: DC | PRN
  Administered 2022-11-09: 500 mL

## 2022-11-09 MED ORDER — ONDANSETRON HCL 4 MG/2ML IJ SOLN
INTRAMUSCULAR | Status: AC
  Filled 2022-11-09: qty 2

## 2022-11-09 MED ORDER — IBUPROFEN 200 MG PO TABS
ORAL_TABLET | ORAL | Status: AC
  Filled 2022-11-09: qty 3

## 2022-11-09 MED ORDER — ACETAMINOPHEN 500 MG PO TABS
1000.0000 mg | ORAL_TABLET | Freq: Four times a day (QID) | ORAL | Status: DC
  Administered 2022-11-09 – 2022-11-10 (×3): 1000 mg via ORAL

## 2022-11-09 MED ORDER — ROCURONIUM BROMIDE 10 MG/ML (PF) SYRINGE
PREFILLED_SYRINGE | INTRAVENOUS | Status: DC | PRN
  Administered 2022-11-09 (×2): 20 mg via INTRAVENOUS
  Administered 2022-11-09: 60 mg via INTRAVENOUS

## 2022-11-09 MED ORDER — LIDOCAINE 20MG/ML (2%) 15 ML SYRINGE OPTIME
INTRAMUSCULAR | Status: DC | PRN
  Administered 2022-11-09: 1.5 mg/kg/h via INTRAVENOUS

## 2022-11-09 MED ORDER — SODIUM CHLORIDE 0.9 % IR SOLN
Status: DC | PRN
  Administered 2022-11-09: 250 mL
  Administered 2022-11-09: 1000 mL
  Administered 2022-11-09: 250 mL

## 2022-11-09 MED ORDER — ONDANSETRON HCL 4 MG/2ML IJ SOLN
4.0000 mg | Freq: Four times a day (QID) | INTRAMUSCULAR | Status: DC | PRN
  Administered 2022-11-09: 4 mg via INTRAVENOUS

## 2022-11-09 MED ORDER — ACETAMINOPHEN 500 MG PO TABS
1000.0000 mg | ORAL_TABLET | Freq: Four times a day (QID) | ORAL | Status: DC | PRN
  Administered 2022-11-09: 1000 mg via ORAL

## 2022-11-09 MED ORDER — MIDAZOLAM HCL 5 MG/5ML IJ SOLN
INTRAMUSCULAR | Status: DC | PRN
  Administered 2022-11-09: 2 mg via INTRAVENOUS

## 2022-11-09 MED ORDER — METHYLENE BLUE 1 % INJ SOLN
INTRAVENOUS | Status: DC | PRN
  Administered 2022-11-09 (×2): 1 mL

## 2022-11-09 MED ORDER — FENTANYL CITRATE (PF) 100 MCG/2ML IJ SOLN
INTRAMUSCULAR | Status: DC | PRN
  Administered 2022-11-09: 100 ug via INTRAVENOUS
  Administered 2022-11-09: 50 ug via INTRAVENOUS

## 2022-11-09 MED ORDER — FENTANYL CITRATE (PF) 100 MCG/2ML IJ SOLN
INTRAMUSCULAR | Status: AC
  Filled 2022-11-09: qty 2

## 2022-11-09 MED ORDER — SUGAMMADEX SODIUM 200 MG/2ML IV SOLN
INTRAVENOUS | Status: DC | PRN
  Administered 2022-11-09: 200 mg via INTRAVENOUS

## 2022-11-09 MED ORDER — OXYCODONE HCL 5 MG PO TABS
ORAL_TABLET | ORAL | Status: AC
  Filled 2022-11-09: qty 1

## 2022-11-09 MED ORDER — ACETAMINOPHEN ER 650 MG PO TBCR: 650.0000 mg | EXTENDED_RELEASE_TABLET | Freq: Four times a day (QID) | ORAL | 3 refills | Status: DC

## 2022-11-09 MED ORDER — PHENYLEPHRINE 80 MCG/ML (10ML) SYRINGE FOR IV PUSH (FOR BLOOD PRESSURE SUPPORT)
PREFILLED_SYRINGE | INTRAVENOUS | Status: DC | PRN
  Administered 2022-11-09: 160 ug via INTRAVENOUS

## 2022-11-09 MED ORDER — OXYCODONE HCL 5 MG PO TABS
5.0000 mg | ORAL_TABLET | ORAL | Status: DC | PRN
  Administered 2022-11-09: 5 mg via ORAL
  Administered 2022-11-09 – 2022-11-10 (×3): 10 mg via ORAL

## 2022-11-09 MED ORDER — OXYCODONE HCL 5 MG PO TABS
5.0000 mg | ORAL_TABLET | Freq: Four times a day (QID) | ORAL | 0 refills | Status: DC | PRN
Start: 2022-11-09 — End: 2023-11-20

## 2022-11-09 MED ORDER — DEXAMETHASONE SODIUM PHOSPHATE 10 MG/ML IJ SOLN
INTRAMUSCULAR | Status: DC | PRN
  Administered 2022-11-09: 5 mg via INTRAVENOUS

## 2022-11-09 MED ORDER — LIDOCAINE 2% (20 MG/ML) 5 ML SYRINGE
INTRAMUSCULAR | Status: DC | PRN
  Administered 2022-11-09: 80 mg via INTRAVENOUS

## 2022-11-09 MED ORDER — DOCUSATE SODIUM 100 MG PO CAPS
100.0000 mg | ORAL_CAPSULE | Freq: Two times a day (BID) | ORAL | Status: DC
  Administered 2022-11-09 (×2): 100 mg via ORAL

## 2022-11-09 MED ORDER — ONDANSETRON HCL 4 MG/2ML IJ SOLN
INTRAMUSCULAR | Status: DC | PRN
  Administered 2022-11-09: 4 mg via INTRAVENOUS

## 2022-11-09 MED ORDER — OXYCODONE HCL 5 MG PO TABS: 5.0000 mg | ORAL_TABLET | Freq: Once | ORAL | Status: DC | PRN

## 2022-11-09 MED ORDER — CEFAZOLIN SODIUM-DEXTROSE 2-4 GM/100ML-% IV SOLN
2.0000 g | INTRAVENOUS | Status: AC
  Administered 2022-11-09: 2 g via INTRAVENOUS

## 2022-11-09 MED ORDER — ONDANSETRON HCL 4 MG PO TABS: 4.0000 mg | ORAL_TABLET | Freq: Four times a day (QID) | ORAL | Status: DC | PRN

## 2022-11-09 MED ORDER — MENTHOL 3 MG MT LOZG: 1.0000 | LOZENGE | OROMUCOSAL | Status: DC | PRN

## 2022-11-09 MED ORDER — MIDAZOLAM HCL 2 MG/2ML IJ SOLN
INTRAMUSCULAR | Status: AC
  Filled 2022-11-09: qty 2

## 2022-11-09 MED ORDER — CEFAZOLIN SODIUM-DEXTROSE 2-4 GM/100ML-% IV SOLN
INTRAVENOUS | Status: AC
  Filled 2022-11-09: qty 100

## 2022-11-09 MED ORDER — PHENYLEPHRINE 80 MCG/ML (10ML) SYRINGE FOR IV PUSH (FOR BLOOD PRESSURE SUPPORT)
PREFILLED_SYRINGE | INTRAVENOUS | Status: AC
  Filled 2022-11-09: qty 10

## 2022-11-09 MED ORDER — MORPHINE SULFATE (PF) 4 MG/ML IV SOLN
1.0000 mg | INTRAVENOUS | Status: DC | PRN
  Administered 2022-11-09: 2 mg via INTRAVENOUS

## 2022-11-09 MED ORDER — ROCURONIUM BROMIDE 10 MG/ML (PF) SYRINGE
PREFILLED_SYRINGE | INTRAVENOUS | Status: AC
  Filled 2022-11-09: qty 10

## 2022-11-09 MED ORDER — IBUPROFEN 200 MG PO TABS
600.0000 mg | ORAL_TABLET | Freq: Four times a day (QID) | ORAL | Status: DC
  Administered 2022-11-09 – 2022-11-10 (×3): 600 mg via ORAL

## 2022-11-09 MED ORDER — DEXAMETHASONE SODIUM PHOSPHATE 10 MG/ML IJ SOLN
INTRAMUSCULAR | Status: AC
  Filled 2022-11-09: qty 1

## 2022-11-09 MED ORDER — FLUORESCEIN SODIUM 10 % IV SOLN
INTRAVENOUS | Status: DC | PRN
  Administered 2022-11-09: 100 mg via INTRAVENOUS

## 2022-11-09 MED ORDER — PROPOFOL 10 MG/ML IV BOLUS
INTRAVENOUS | Status: DC | PRN
  Administered 2022-11-09: 150 mg via INTRAVENOUS

## 2022-11-09 MED ORDER — PROPOFOL 10 MG/ML IV BOLUS
INTRAVENOUS | Status: AC
  Filled 2022-11-09: qty 20

## 2022-11-09 SURGICAL SUPPLY — 87 items
ADH SKN CLS APL DERMABOND .7 (GAUZE/BANDAGES/DRESSINGS) ×3
APL ESCP 73.6OZ SRGCL (TIP) ×3
APL SRG 38 LTWT LNG FL B (MISCELLANEOUS)
APPLICATOR ARISTA FLEXITIP XL (MISCELLANEOUS) IMPLANT
BAG DRN RND TRDRP ANRFLXCHMBR (UROLOGICAL SUPPLIES) ×3
BAG URINE DRAIN 2000ML AR STRL (UROLOGICAL SUPPLIES) ×3 IMPLANT
BARRIER ADHS 3X4 INTERCEED (GAUZE/BANDAGES/DRESSINGS) IMPLANT
BLADE SURG SZ10 CARB STEEL (BLADE) IMPLANT
BRR ADH 4X3 ABS CNTRL BYND (GAUZE/BANDAGES/DRESSINGS)
CANNULA CAP OBTURATR AIRSEAL 8 (CAP) ×3 IMPLANT
CATH FOLEY 3WAY  5CC 16FR (CATHETERS) ×3
CATH FOLEY 3WAY 5CC 16FR (CATHETERS) ×3 IMPLANT
CAUTERY HOOK MNPLR 1.6 DVNC XI (INSTRUMENTS) IMPLANT
CELLS DAT CNTRL 66122 CELL SVR (MISCELLANEOUS) IMPLANT
COVER BACK TABLE 60X90IN (DRAPES) ×3 IMPLANT
COVER TIP SHEARS 8 DVNC (MISCELLANEOUS) ×3 IMPLANT
DEFOGGER SCOPE WARMER CLEARIFY (MISCELLANEOUS) ×3 IMPLANT
DERMABOND ADVANCED .7 DNX12 (GAUZE/BANDAGES/DRESSINGS) ×3 IMPLANT
DILATOR CANAL MILEX (MISCELLANEOUS) IMPLANT
DRAPE ARM DVNC X/XI (DISPOSABLE) ×12 IMPLANT
DRAPE COLUMN DVNC XI (DISPOSABLE) ×3 IMPLANT
DRAPE SURG IRRIG POUCH 19X23 (DRAPES) ×3 IMPLANT
DRAPE UTILITY XL STRL (DRAPES) ×3 IMPLANT
DRIVER NDL MEGA 8 DVNC XI (INSTRUMENTS) ×3 IMPLANT
DRIVER NDLE MEGA DVNC XI (INSTRUMENTS) ×3 IMPLANT
DRSG OPSITE POSTOP 4X10 (GAUZE/BANDAGES/DRESSINGS) IMPLANT
DURAPREP 26ML APPLICATOR (WOUND CARE) ×3 IMPLANT
ELECT REM PT RETURN 9FT ADLT (ELECTROSURGICAL) ×3
ELECTRODE REM PT RTRN 9FT ADLT (ELECTROSURGICAL) ×3 IMPLANT
FORCEPS BPLR LNG DVNC XI (INSTRUMENTS) ×3 IMPLANT
FORCEPS LONG TIP 8 DVNC XI (FORCEP) ×3 IMPLANT
FORCEPS PROGRASP DVNC XI (FORCEP) IMPLANT
GAUZE 4X4 16PLY ~~LOC~~+RFID DBL (SPONGE) IMPLANT
GLOVE BIO SURGEON STRL SZ 6.5 (GLOVE) ×9 IMPLANT
GLOVE BIOGEL PI IND STRL 6.5 (GLOVE) ×9 IMPLANT
HEMOSTAT ARISTA ABSORB 3G PWDR (HEMOSTASIS) IMPLANT
HOLDER FOLEY CATH W/STRAP (MISCELLANEOUS) IMPLANT
IRRIG SUCT STRYKERFLOW 2 WTIP (MISCELLANEOUS) ×3
IRRIGATION SUCT STRKRFLW 2 WTP (MISCELLANEOUS) ×3 IMPLANT
IV NS 1000ML (IV SOLUTION) ×12
IV NS 1000ML BAXH (IV SOLUTION) IMPLANT
KIT PINK PAD W/HEAD ARE REST (MISCELLANEOUS) ×3
KIT PINK PAD W/HEAD ARM REST (MISCELLANEOUS) ×3 IMPLANT
KIT TURNOVER CYSTO (KITS) ×3 IMPLANT
LEGGING LITHOTOMY PAIR STRL (DRAPES) ×3 IMPLANT
NS IRRIG 500ML POUR BTL (IV SOLUTION) IMPLANT
OBTURATOR OPTICAL STND 8 DVNC (TROCAR) ×3
OBTURATOR OPTICALSTD 8 DVNC (TROCAR) ×3 IMPLANT
OCCLUDER COLPOPNEUMO (BALLOONS) ×3 IMPLANT
PACK ROBOT WH (CUSTOM PROCEDURE TRAY) ×3 IMPLANT
PACK ROBOTIC GOWN (GOWN DISPOSABLE) ×3 IMPLANT
PAD OB MATERNITY 4.3X12.25 (PERSONAL CARE ITEMS) ×3 IMPLANT
PAD PREP 24X48 CUFFED NSTRL (MISCELLANEOUS) ×3 IMPLANT
POWDER SURGICEL 3.0 GRAM (HEMOSTASIS) IMPLANT
RETRACTOR WND ALEXIS 18 MED (MISCELLANEOUS) IMPLANT
RTRCTR WOUND ALEXIS 18CM MED (MISCELLANEOUS)
RTRCTR WOUND ALEXIS 18CM SML (INSTRUMENTS)
SAVER CELL AAL HAEMONETICS (INSTRUMENTS) IMPLANT
SCISSORS LAP 5X45 EPIX DISP (ENDOMECHANICALS) IMPLANT
SCISSORS MNPLR CVD DVNC XI (INSTRUMENTS) ×3 IMPLANT
SEAL UNIV 5-12 XI (MISCELLANEOUS) ×6 IMPLANT
SEALER VESSEL EXT DVNC XI (MISCELLANEOUS) IMPLANT
SET IRRIG Y TYPE TUR BLADDER L (SET/KITS/TRAYS/PACK) IMPLANT
SET TRI-LUMEN FLTR TB AIRSEAL (TUBING) IMPLANT
SET TUBE FILTERED XL AIRSEAL (SET/KITS/TRAYS/PACK) ×3 IMPLANT
SLEEVE SCD COMPRESS KNEE MED (STOCKING) ×3 IMPLANT
SPIKE FLUID TRANSFER (MISCELLANEOUS) ×6 IMPLANT
SPONGE T-LAP 18X18 ~~LOC~~+RFID (SPONGE) IMPLANT
SUT MNCRL+ AB 3-0 CT1 36 (SUTURE) IMPLANT
SUT PDS AB 0 CT1 27 (SUTURE) IMPLANT
SUT VIC AB 0 CT1 27 (SUTURE) ×9
SUT VIC AB 0 CT1 27XBRD ANBCTR (SUTURE) ×6 IMPLANT
SUT VIC AB 0 CT1 27XCR 8 STRN (SUTURE) IMPLANT
SUT VIC AB 2-0 CT1 (SUTURE) IMPLANT
SUT VIC AB 4-0 KS 27 (SUTURE) IMPLANT
SUT VICRYL 0 UR6 27IN ABS (SUTURE) IMPLANT
SUT VICRYL RAPIDE 4/0 PS 2 (SUTURE) ×6 IMPLANT
SUT VLOC 180 0 9IN  GS21 (SUTURE) ×3
SUT VLOC 180 0 9IN GS21 (SUTURE) ×3 IMPLANT
SYR BULB IRRIG 60ML STRL (SYRINGE) IMPLANT
SYS LAPSCP GELPORT 120MM (MISCELLANEOUS) ×3
SYSTEM LAPSCP GELPORT 120MM (MISCELLANEOUS) IMPLANT
TIP ENDOSCOPIC SURGICEL (TIP) IMPLANT
TIP RUMI ORANGE 6.7MMX12CM (TIP) IMPLANT
TOWEL OR 17X24 6PK STRL BLUE (TOWEL DISPOSABLE) ×3 IMPLANT
TROCAR PORT AIRSEAL 8X120 (TROCAR) IMPLANT
WATER STERILE IRR 1000ML POUR (IV SOLUTION) ×3 IMPLANT

## 2022-11-09 NOTE — Anesthesia Preprocedure Evaluation (Signed)
Anesthesia Evaluation  Patient identified by MRN, date of birth, ID band Patient awake    Reviewed: Allergy & Precautions, H&P , NPO status , Patient's Chart, lab work & pertinent test results  Airway Mallampati: II   Neck ROM: full    Dental   Pulmonary neg pulmonary ROS   breath sounds clear to auscultation       Cardiovascular negative cardio ROS  Rhythm:regular Rate:Normal     Neuro/Psych  Headaches    GI/Hepatic   Endo/Other    Renal/GU      Musculoskeletal   Abdominal   Peds  Hematology   Anesthesia Other Findings   Reproductive/Obstetrics Uterine fibroids                             Anesthesia Physical Anesthesia Plan  ASA: 2  Anesthesia Plan: General   Post-op Pain Management:    Induction: Intravenous  PONV Risk Score and Plan: 3 and Ondansetron, Dexamethasone, Midazolam and Treatment may vary due to age or medical condition  Airway Management Planned: Oral ETT  Additional Equipment:   Intra-op Plan:   Post-operative Plan: Extubation in OR  Informed Consent: I have reviewed the patients History and Physical, chart, labs and discussed the procedure including the risks, benefits and alternatives for the proposed anesthesia with the patient or authorized representative who has indicated his/her understanding and acceptance.     Dental advisory given  Plan Discussed with: CRNA, Anesthesiologist and Surgeon  Anesthesia Plan Comments:        Anesthesia Quick Evaluation

## 2022-11-09 NOTE — Interval H&P Note (Signed)
History and Physical Interval Note:  11/09/2022 11:04 AM  Danielle [name]  has presented today for surgery, with the diagnosis of Fibroids Pelvic Pain.  The various methods of treatment have been discussed with the patient and family. After consideration of risks, benefits and other options for treatment, the patient has consented to  Procedure(s): XI ROBOTIC ASSISTED LAPAROSCOPIC HYSTERECTOMY AND SALPINGECTOMY (Bilateral) as a surgical intervention.  The patient's history has been reviewed, patient examined, no change in status, stable for surgery.  I have reviewed the patient's chart and labs.  Questions were answered to the patient's satisfaction.     [name]

## 2022-11-09 NOTE — Op Note (Addendum)
Pre Op Dx:   1. Symptomatic fibroid uterus 2. Abnormal uterine bleeding (heavy menstrual bleeding)  Post Op Dx:   Same as pre-operative diagnoses  Procedure:   1. Robotic Assisted Total Laparoscopic Hysterectomy and Bilateral Salpingectomy  2. Mini laparotomy 3. Cystoscopy   Surgeon:  Dr. [name] Assistants:  Karmen Stabs, RNFA Anesthesia:  General   EBL:  200cc  IVF:  1800cc UOP:  250cc clear yellow urine   Drains:  Foley catheter Specimen removed:  Uterus, cervix, bilateral fallopian tubes - sent to pathology Device(s) implanted: None Case Type:  Clean-contaminated Findings:  Normal-appearing cervix. Enlarged ~15cm fibroid uterus weighing approximately 400g. Evidence of prior bilateral tubal ligation, otherwise normal-appearing bilateral fallopian tubes. Normal-appearing ovaries bilaterally. Normal-appearing liver contours. A thick adhesion band noted between the anterior uterus, bladder, and anterior abdominal wall. Bilateral ureters were visualized with peristalsis pre and post hysterectomy. Bilateral ureteral jets were noted on Cystoscopy. Bladder wall appeared intact with two ~1-2cm areas of hyperemia. Complications: None Indications:  52 y.o. G3P3 with symptomatic fibroid uterus and AUB who desires definitive surgical management.  Description of each procedure:  After informed consent the patient was taken to the operating room and placed in dorsal supine position where general endotracheal anesthesia was administered and found to be adequate.  She was placed in dorsal lithotomy position with her arms tucked.  She was prepped and draped in the usual sterile fashion.  A timeout was called and the procedure confirmed.  A RUMI uterine manipulator with the Koh cup and a Foley catheter were placed.   A 5mm supraumbilical incision was made and a trochar was used to enter the abdomen under direct visualization.  Pneumoperitoneum was established and atraumatic entry confirmed.  Three additional 5mm ports were placed on either side of the umbilicus and an 8mm port was placed in the left upper quadrant under direct visualization. All port sites were injected with 10cc local anesthetic. The pelvis was bathed in a 60cc Ropivicaine solution.  The patient was placed in Trendelenburg position and the [employer] robotic device was docked. Next, attention was turned to the console where the hysterectomy was performed.  The right fallopian tube was divided at the mesosalpinx and separated from the uterine cornua and passed of the field. The uteroovarian anastamosis was then divided and the right round ligament was divided. This process was repeated on the contralateral side. Before the bladder flap could be developed, the adhesion band noted between the anterior uterus to the bladder and anterior abdominal wall was resected electrosurgically after the bladder was backfilled with 250cc Methylene blue. While taking down adhesions using the laparoscopic scissors with heat, there was a small thermal injury to the bladder just briefly with the tips of the scissors. There was no extravasation of Methylene blue. Intraoperatively, Dr. Di Kindle (Urology) was consulted to assess this area. It was recommended that the foley remain in place for at least 3 days to allow for bladder rest but this thermal injury appeared superficial and on the serosal surface only. The anterior leaflet of the broad ligament was divided to create a bladder flap. The uterine artery and vein were skeletonized and desiccated superior to the Koh cup. This process was repeated on the contralateral side.  Uterine blanching was observed.  A circumferential colpotomy was created along the ridge of the Koh cup anteriorly. Due to the size of the uterus, unable to visualize posteriorly. Decision was made to perform mini laparotomy for completion of the colpotomy and  removal of the uterus due to its bulk and size.  The da Vinci  robotic device was then undocked and taken out of steep Trendelenburg. A abdomen was entered using a mini pfannenstiel incision. An Alexis retractor was placed and there was difficulty lifting the uterus through the incision due to its size. The incision was extended on both sides. The uterus was then lifted and after packing the bowel away with two sponges, visualization posteriorly was excellent and the posterior portion of the colpotomy was completed using the Bovie electrosurgery. The uterus and cervix was then removed and passed off the field. Hemostasis achieved along the vaginal cuff with electrosurgery. The sponges were removed. A gel port was placed atop the Alexis retractor. The patient was placed back in Trendelenburg and the da Vinci robotic device was re-docked. Pneumoperitoneum was re-established.  The vaginal cuff was then closed with V-loc suture. Hemostasis confirmed. Surgicel hemostatic powder was placed on the vaginal cuff and adnexa bilaterally. Adequate suction-irrigation performed with excellent hemostasis noted. The Da Vinci robotic device was undocked and all ports were visualized.   The pneumoperitoneum was reduced completely. The peritoneum at the mini laparotomy site was closed using 2-0 Vicryl. The fascia was closed using 0-PDS. The subcutaneous tissues were irrigated and closed using 3-0 Monocryl. The skin was closed using 4-0 Vicryl at the minilaparotomy site and skin glue placed atop. The skin was closed with 4-0 Vicryl in subcuticular fashion with skin glue placed atop each port site.  Fluorescein was given IV prior to cystoscopy. Cystoscopy was performed and demonstrated intact urothelium throughout the bladder, a normal-appearing trigone and bilaterally patent ureteral orifices with normal urine jets noted. Two small areas of hyperemia were noted.  The vagina was inspected and there were no vaginal tears noted and no foreign objects remaining in the vagina and the vaginal  cuff palpated to be intact. The patient was returned to dorsal supine position, awakened and extubated in the OR having appeared to tolerate the procedure well.  All sponge, needle, and instrument counts were correct x 2 at the end of the case.  Disposition:  PACU  [name], DO

## 2022-11-09 NOTE — Anesthesia Procedure Notes (Signed)
Procedure Name: Intubation Date/Time: 11/09/2022 11:12 AM  Performed by: Yardley Lekas D, CRNAPre-anesthesia Checklist: Patient identified, Emergency Drugs available, Suction available and Patient being monitored Patient Re-evaluated:Patient Re-evaluated prior to induction Oxygen Delivery Method: Circle system utilized Preoxygenation: Pre-oxygenation with 100% oxygen Induction Type: IV induction Ventilation: Mask ventilation without difficulty Laryngoscope Size: Miller and 1 Grade View: Grade II Tube type: Oral Tube size: 7.0 mm Number of attempts: 1 Airway Equipment and Method: Stylet and Oral airway Placement Confirmation: ETT inserted through vocal cords under direct vision, positive ETCO2 and breath sounds checked- equal and bilateral Secured at: 21 cm Tube secured with: Tape Dental Injury: Teeth and Oropharynx as per pre-operative assessment

## 2022-11-09 NOTE — Transfer of Care (Signed)
Immediate Anesthesia Transfer of Care Note  Patient: Danielle [name]  Procedure(s) Performed: XI ROBOTIC ASSISTED LAPAROSCOPIC HYSTERECTOMY AND SALPINGECTOMY (Bilateral: Abdomen) GELPORT ASSISTED MINI LAPAROTOMY (Abdomen) CYSTOSCOPY (Bladder)  Patient Location: PACU  Anesthesia Type:General  Level of Consciousness: awake, alert , and oriented  Airway & Oxygen Therapy: Patient Spontanous Breathing and Patient connected to face mask oxygen  Post-op Assessment: Report given to RN and Post -op Vital signs reviewed and stable  Post vital signs: Reviewed and stable  Last Vitals:  Vitals Value Taken Time  BP 135/92 11/09/22 1440  Temp 36.4 C 11/09/22 1440  Pulse 76 11/09/22 1443  Resp 7 11/09/22 1443  SpO2 100 % 11/09/22 1443  Vitals shown include unvalidated device data.  Last Pain:  Vitals:   11/09/22 1007  TempSrc: Oral  PainSc: 0-No pain      Patients Stated Pain Goal: 5 (11/09/22 1007)  Complications: No notable events documented.

## 2022-11-09 NOTE — Discharge Summary (Incomplete)
Physician Discharge Summary  Patient ID: Danielle [name] MRN: [id number] DOB/AGE: 50-27-1972 50 y.o.  Admit date: 11/09/2022 Discharge date: 11/10/2022  Admission Diagnoses: 1. Symptomatic fibroid uterus 2. Abnormal uterine bleeding  Discharge Diagnoses:  Principal Problem:   Fibroid uterus Abnormal uterine bleeding  Procedure(s): 1. Robotic Assisted Total Laparoscopic Hysterectomy and Bilateral Salpingectomy  2. Mini laparotomy 3. Cystoscopy  Discharged Condition: good  Hospital Course: Patient was admitted on 11/09/2022 for the above named procedure(s) for the above named diagnoses. Prior to hospital discharge, patient was tolerating PO, ambulating, voiding spontaneously, and pain was well-controlled. She was discharged home with a foley catheter due to brief, superficial injury to the serosal layer to the bladder. See hospital chart for specific details. Patient was discharged home in stable condition.  Consults: None  Significant Diagnostic Studies: None  Treatments: surgery: As documented above  Discharge Exam: Blood pressure 130/68, pulse 80, temperature 98.4 F (36.9 C), resp. rate 16, height 5\' 5"  (1.651 m), weight 74.8 kg, last menstrual period 10/27/2022, SpO2 97 %. Gen:  NAD, pleasant and cooperative Cardio:  RRR Pulm:  CTAB, no wheezes/rales/rhonchi Abd:  Soft, non-distended, non-tender throughout, no rebound/guarding, laparoscopic port sites clean/dry/intact with skin glue atop, mini laparotomy clean/dry/intact with honeycomb dressing atop Ext:  No bilateral LE edema, no bilateral calf tenderness, SCDs on an working   Disposition: Discharge disposition: 01-Home or Self Care        Discharge Instructions     Call MD for:  difficulty breathing, headache or visual disturbances   Complete by: As directed    Call MD for:  extreme fatigue   Complete by: As directed    Call MD for:  hives   Complete by: As directed    Call MD for:  persistant dizziness or  light-headedness   Complete by: As directed    Call MD for:  persistant nausea and vomiting   Complete by: As directed    Call MD for:  redness, tenderness, or signs of infection (pain, swelling, redness, odor or green/yellow discharge around incision site)   Complete by: As directed    Call MD for:  severe uncontrolled pain   Complete by: As directed    Call MD for:  temperature >100.4   Complete by: As directed    Diet - low sodium heart healthy   Complete by: As directed    Driving Restrictions   Complete by: As directed    No driving for at least 2 weeks. No driving while using narcotic medications as they can make you drowsy.   Increase activity slowly   Complete by: As directed    Lifting restrictions   Complete by: As directed    No lifting greater than 10lbs for 6 weeks.   Sexual Activity Restrictions   Complete by: As directed    No sexual intercourse or objects in the vagina for at least 6 weeks.      Allergies as of 11/10/2022       Reactions   Septra [bactrim] Nausea And Vomiting   Flagyl [metronidazole] Nausea Only        Medication List     TAKE these medications    acetaminophen 650 MG CR tablet Commonly known as: Tylenol 8 Hour Take 1 tablet (650 mg total) by mouth every 6 (six) hours.   ibuprofen 800 MG tablet Commonly known as: ADVIL Take 1 tablet (800 mg total) by mouth every 8 (eight) hours as needed for mild pain, moderate pain or  cramping.   oxyCODONE 5 MG immediate release tablet Commonly known as: Oxy IR/ROXICODONE Take 1 tablet (5 mg total) by mouth every 6 (six) hours as needed for severe pain or breakthrough pain.         Follow-up Information     [name], DO Follow up.   Specialty: Obstetrics and Gynecology Why: Our office will schedule a time for you to have your foley catheter removed on Monday, 5/9. Contact information: [address] [place] [address] [phone]                  Signed: [name] 11/10/2022, 6:47 AM

## 2022-11-10 ENCOUNTER — Encounter (HOSPITAL_BASED_OUTPATIENT_CLINIC_OR_DEPARTMENT_OTHER): Payer: Self-pay | Admitting: Obstetrics and Gynecology

## 2022-11-10 DIAGNOSIS — N8003 Adenomyosis of the uterus: Secondary | ICD-10-CM | POA: Diagnosis not present

## 2022-11-10 DIAGNOSIS — N838 Other noninflammatory disorders of ovary, fallopian tube and broad ligament: Secondary | ICD-10-CM | POA: Diagnosis not present

## 2022-11-10 DIAGNOSIS — D251 Intramural leiomyoma of uterus: Secondary | ICD-10-CM | POA: Diagnosis not present

## 2022-11-10 DIAGNOSIS — N939 Abnormal uterine and vaginal bleeding, unspecified: Secondary | ICD-10-CM | POA: Diagnosis not present

## 2022-11-10 DIAGNOSIS — N3289 Other specified disorders of bladder: Secondary | ICD-10-CM | POA: Diagnosis not present

## 2022-11-10 MED ORDER — IBUPROFEN 200 MG PO TABS
ORAL_TABLET | ORAL | Status: AC
  Filled 2022-11-10: qty 3

## 2022-11-10 MED ORDER — ACETAMINOPHEN 500 MG PO TABS
ORAL_TABLET | ORAL | Status: AC
  Filled 2022-11-10: qty 2

## 2022-11-10 MED ORDER — OXYCODONE HCL 5 MG PO TABS
ORAL_TABLET | ORAL | Status: AC
  Filled 2022-11-10: qty 2

## 2022-11-10 NOTE — Anesthesia Postprocedure Evaluation (Signed)
Anesthesia Post Note  Patient: Danielle [name]  Procedure(s) Performed: XI ROBOTIC ASSISTED LAPAROSCOPIC HYSTERECTOMY AND SALPINGECTOMY (Bilateral: Abdomen) GELPORT ASSISTED MINI LAPAROTOMY (Abdomen) CYSTOSCOPY (Bladder)     Patient location during evaluation: PACU Anesthesia Type: General Level of consciousness: awake and alert Pain management: pain level controlled Vital Signs Assessment: post-procedure vital signs reviewed and stable Respiratory status: spontaneous breathing, nonlabored ventilation, respiratory function stable and patient connected to nasal cannula oxygen Cardiovascular status: blood pressure returned to baseline and stable Postop Assessment: no apparent nausea or vomiting Anesthetic complications: no   No notable events documented.  Last Vitals:  Vitals:   11/10/22 0646 11/10/22 0827  BP: 130/64 120/68  Pulse: 84 82  Resp: 18 16  Temp: 36.9 C 36.8 C  SpO2: 98% 97%    Last Pain:  Vitals:   11/10/22 0827  TempSrc:   PainSc: 5                  Wilmont Olund S

## 2022-11-11 LAB — SURGICAL PATHOLOGY

## 2022-11-24 DIAGNOSIS — R42 Dizziness and giddiness: Secondary | ICD-10-CM | POA: Diagnosis not present

## 2023-01-24 ENCOUNTER — Other Ambulatory Visit: Payer: Self-pay | Admitting: Family Medicine

## 2023-01-24 DIAGNOSIS — N6310 Unspecified lump in the right breast, unspecified quadrant: Secondary | ICD-10-CM

## 2023-01-25 DIAGNOSIS — R7303 Prediabetes: Secondary | ICD-10-CM | POA: Diagnosis not present

## 2023-01-25 DIAGNOSIS — Z0000 Encounter for general adult medical examination without abnormal findings: Secondary | ICD-10-CM | POA: Diagnosis not present

## 2023-01-25 DIAGNOSIS — E785 Hyperlipidemia, unspecified: Secondary | ICD-10-CM | POA: Diagnosis not present

## 2023-01-30 DIAGNOSIS — L810 Postinflammatory hyperpigmentation: Secondary | ICD-10-CM | POA: Diagnosis not present

## 2023-01-30 DIAGNOSIS — L728 Other follicular cysts of the skin and subcutaneous tissue: Secondary | ICD-10-CM | POA: Diagnosis not present

## 2023-01-30 DIAGNOSIS — L700 Acne vulgaris: Secondary | ICD-10-CM | POA: Diagnosis not present

## 2023-02-02 ENCOUNTER — Ambulatory Visit
Admission: RE | Admit: 2023-02-02 | Discharge: 2023-02-02 | Disposition: A | Discharge status: Home / Self Care | Payer: BC Managed Care – PPO | Source: Ambulatory Visit | Attending: Family Medicine | Admitting: Family Medicine

## 2023-02-02 ENCOUNTER — Ambulatory Visit
Admission: RE | Admit: 2023-02-02 | Discharge: 2023-02-02 | Disposition: A | Discharge status: Home / Self Care | Payer: No Typology Code available for payment source | Source: Ambulatory Visit | Attending: Family Medicine | Admitting: Family Medicine

## 2023-02-02 DIAGNOSIS — N6310 Unspecified lump in the right breast, unspecified quadrant: Secondary | ICD-10-CM

## 2023-02-02 DIAGNOSIS — N6021 Fibroadenosis of right breast: Secondary | ICD-10-CM | POA: Diagnosis not present

## 2023-02-17 DIAGNOSIS — L728 Other follicular cysts of the skin and subcutaneous tissue: Secondary | ICD-10-CM | POA: Diagnosis not present

## 2023-02-17 DIAGNOSIS — L538 Other specified erythematous conditions: Secondary | ICD-10-CM | POA: Diagnosis not present

## 2023-02-17 DIAGNOSIS — L700 Acne vulgaris: Secondary | ICD-10-CM | POA: Diagnosis not present

## 2023-06-06 DIAGNOSIS — R1031 Right lower quadrant pain: Secondary | ICD-10-CM | POA: Diagnosis not present

## 2023-06-06 DIAGNOSIS — N941 Unspecified dyspareunia: Secondary | ICD-10-CM | POA: Diagnosis not present

## 2023-06-06 DIAGNOSIS — R109 Unspecified abdominal pain: Secondary | ICD-10-CM | POA: Diagnosis not present

## 2023-06-15 DIAGNOSIS — N941 Unspecified dyspareunia: Secondary | ICD-10-CM | POA: Diagnosis not present

## 2023-06-15 DIAGNOSIS — R102 Pelvic and perineal pain: Secondary | ICD-10-CM | POA: Diagnosis not present

## 2023-06-23 DIAGNOSIS — B9689 Other specified bacterial agents as the cause of diseases classified elsewhere: Secondary | ICD-10-CM | POA: Diagnosis not present

## 2023-06-23 DIAGNOSIS — N760 Acute vaginitis: Secondary | ICD-10-CM | POA: Diagnosis not present

## 2023-07-25 DIAGNOSIS — R208 Other disturbances of skin sensation: Secondary | ICD-10-CM | POA: Diagnosis not present

## 2023-07-25 DIAGNOSIS — L2989 Other pruritus: Secondary | ICD-10-CM | POA: Diagnosis not present

## 2023-07-25 DIAGNOSIS — L239 Allergic contact dermatitis, unspecified cause: Secondary | ICD-10-CM | POA: Diagnosis not present

## 2023-07-25 DIAGNOSIS — L918 Other hypertrophic disorders of the skin: Secondary | ICD-10-CM | POA: Diagnosis not present

## 2023-07-25 DIAGNOSIS — L538 Other specified erythematous conditions: Secondary | ICD-10-CM | POA: Diagnosis not present

## 2023-07-25 DIAGNOSIS — L700 Acne vulgaris: Secondary | ICD-10-CM | POA: Diagnosis not present

## 2023-11-02 ENCOUNTER — Other Ambulatory Visit: Payer: Self-pay

## 2023-11-02 ENCOUNTER — Ambulatory Visit: Admitting: Obstetrics and Gynecology

## 2023-11-02 ENCOUNTER — Encounter: Payer: Self-pay | Admitting: Obstetrics and Gynecology

## 2023-11-02 VITALS — BP 144/82 | HR 82 | Wt 173.2 lb

## 2023-11-02 DIAGNOSIS — N941 Unspecified dyspareunia: Secondary | ICD-10-CM | POA: Diagnosis not present

## 2023-11-02 DIAGNOSIS — G8929 Other chronic pain: Secondary | ICD-10-CM

## 2023-11-02 DIAGNOSIS — R102 Pelvic and perineal pain: Secondary | ICD-10-CM | POA: Diagnosis not present

## 2023-11-02 NOTE — Progress Notes (Signed)
 NEW GYNECOLOGY PATIENT Patient name: Danielle [name] MRN [id number]  Date of birth: [birth year] Chief Complaint:   No chief complaint on file.     History:  Danielle [name] is a 53 y.o. [phone] being seen today for pelvic pain.    Underwent RA-TLH,BS, cysto 11/2022, presented in 06/2023 with abdominal pain and pain with intercourse.   States pain in her pelvis. Prior to surgery, no problems with intercourse. Now having pain with intercourse, dull pian, just during. No vaginal dryness. No issues with chronic pain. Does not have this pain with other activities. Does not masturbation. Also notes it is difficult to hold her urine since her procedure. No stingin or burning sensation . No bleeding at all either. No abnormal vaginal discharge. Has not taken anything for the pain. While supine or on top will have the pain.       Gynecologic History No LMP recorded. Contraception: status post hysterectomy Last Pap: Status post hysterectomy Last Mammogram: 02/2023 BI-RADS 3 Last Colonoscopy: n/a  Obstetric History OB History  Gravida Para Term Preterm AB Living  6 3   2 3   SAB IAB Ectopic Multiple Live Births  1 1       # Outcome Date GA Lbr Len/2nd Weight Sex Type Anes PTL Lv  6 SAB           5 Para           4 Para           3 Para           2 IAB           1 Gravida             Past Medical History:  Diagnosis Date   Bacterial vaginosis    RECURRENT   Difficult intravenous access    Difficult IV stick.   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   HSV (herpes simplex virus) anogenital infection    Migraines    takes OTC   Pre-diabetes 2019   04/17/18 HgbA1c 6.2   Wears glasses    readers only    Past Surgical History:  Procedure Laterality Date   BREAST BIOPSY Right 01/21/2022   right breast core needle biopsy / pathology=fibroadenoma, neg for malignancy   CESAREAN SECTION  12/26/2000   CHOLECYSTECTOMY N/A 04/06/2019   Procedure: LAPAROSCOPIC  CHOLECYSTECTOMY;  Surgeon: Sim Dryer, MD;  Location: [facility] OR;  Service: General;  Laterality: N/A;   CYST EXCISION N/A 04/21/2017   Procedure: EXCISION OF SEBACEOUS CYST OF BILATERAL THIGHS AND LEFT LABIA;  Surgeon: Adalberto Acton, MD;  Location: [facility];  Service: General;  Laterality: N/A;   CYSTOSCOPY  11/09/2022   Procedure: CYSTOSCOPY;  Surgeon: Meldon Sport, DO;  Location: [facility];  Service: Gynecology;;   INCISE AND DRAIN ABCESS  09/23/2009   LEFT LABIAL-SKENE    LAPAROTOMY  11/09/2022   Procedure: GELPORT ASSISTED MINI LAPAROTOMY;  Surgeon: Meldon Sport, DO;  Location: [facility] [place];  Service: Gynecology;;   ROBOTIC ASSISTED LAPAROSCOPIC HYSTERECTOMY AND SALPINGECTOMY Bilateral 11/09/2022   Procedure: XI ROBOTIC ASSISTED LAPAROSCOPIC HYSTERECTOMY AND SALPINGECTOMY;  Surgeon: Meldon Sport, DO;  Location: [facility];  Service: Gynecology;  Laterality: Bilateral;   TUBAL LIGATION  07/04/2007    Current Outpatient Medications on File Prior to Visit  Medication Sig Dispense Refill   acetaminophen  (TYLENOL  8 HOUR) 650 MG CR tablet Take 1 tablet (650 mg total) by mouth  every 6 (six) hours. 30 tablet 3   ibuprofen  (ADVIL ) 800 MG tablet Take 1 tablet (800 mg total) by mouth every 8 (eight) hours as needed for mild pain, moderate pain or cramping. 30 tablet 1   oxyCODONE  (OXY IR/ROXICODONE ) 5 MG immediate release tablet Take 1 tablet (5 mg total) by mouth every 6 (six) hours as needed for severe pain or breakthrough pain. 30 tablet 0   No current facility-administered medications on file prior to visit.    Allergies  Allergen Reactions   Septra [Bactrim] Nausea And Vomiting   Flagyl  [Metronidazole ] Nausea Only    Social History:  reports that she has never smoked. She has never used smokeless tobacco. She reports current alcohol use. She reports that she does not use drugs.  Family History  Problem Relation Age  of Onset   Diabetes Mother    Hypertension Mother    Diabetes Father     The following portions of the patient's history were reviewed and updated as appropriate: allergies, current medications, past family history, past medical history, past social history, past surgical history and problem list.  Review of Systems Pertinent items noted in HPI and remainder of comprehensive ROS otherwise negative.  Physical Exam:  There were no vitals taken for this visit. Physical Exam Vitals and nursing note reviewed. Exam conducted with a chaperone present.  Constitutional:      Appearance: Normal appearance.  Pulmonary:     Effort: Pulmonary effort is normal.  Abdominal:     Palpations: Abdomen is soft.     Comments: + [place] Multiple trigger points  Genitourinary:    General: Normal vulva.     Exam position: Lithotomy position.     Comments: Normal appearing vulva Normal vulvar sensation bilaterally Nontender superficial pelvic floor muscles Nontender ischial tuberosities bilaterally  Allodynia at introitus: No  Anal wink present Posterior vaginal wall nontender Right levator ani 1/10 Right ischiococcygeous 4/10 Right obturator internus 4/10 Left levator ani 5/10 Left ischioccocygeous 5/10 Left obturator internus 5/10 Deep levator tenderness bilaterally   Neurological:     Mental Status: She is alert.     Abdominal Trigger Point Procedure Patient identified, informed consent performed, consent signed. The abdominal wall was examined and 4 points were identified and marked. Each site was cleaned with alcohol wipes. Each site was sprayed with hurricane spray and the needle introduced into the abdominal wall. Once the trigger point was confirmed to be entered by patient report of pain, 1cc of 1% lidocaine  was injected and then pressure held at the injection site. The patient tolerated the procedure well and was given post procedure instructions.      Assessment and Plan:   1.  Chronic pelvic pain in female (Primary) Exam demonstrates abdominal and pelvic floor myalgia.  Now status post uncomplicated abdominal wall trigger point injections.  Will follow-up response to trigger point injections.  Follow-up response to PFPT.  2. Dyspareunia in female Exam notable for pelvic myalgia.  Recommend pelvic floor physical therapy, referral accepted. - Ambulatory referral to Physical Therapy  Routine preventative health maintenance measures emphasized. Please refer to After Visit Summary for other counseling recommendations.   Follow-up: No follow-ups on file.      Kiki Pelton, MD Obstetrician & Gynecologist, [facility] [employer], [facility]

## 2023-11-13 DIAGNOSIS — R079 Chest pain, unspecified: Secondary | ICD-10-CM | POA: Diagnosis not present

## 2023-11-20 ENCOUNTER — Ambulatory Visit: Attending: Internal Medicine | Admitting: Internal Medicine

## 2023-11-20 VITALS — BP 140/84 | HR 76 | Ht 65.0 in | Wt 171.0 lb

## 2023-11-20 DIAGNOSIS — R011 Cardiac murmur, unspecified: Secondary | ICD-10-CM | POA: Insufficient documentation

## 2023-11-20 DIAGNOSIS — R079 Chest pain, unspecified: Secondary | ICD-10-CM | POA: Diagnosis not present

## 2023-11-20 DIAGNOSIS — E782 Mixed hyperlipidemia: Secondary | ICD-10-CM | POA: Insufficient documentation

## 2023-11-20 DIAGNOSIS — I10 Essential (primary) hypertension: Secondary | ICD-10-CM | POA: Diagnosis not present

## 2023-11-20 MED ORDER — CARVEDILOL 3.125 MG PO TABS: 3.1250 mg | ORAL_TABLET | Freq: Two times a day (BID) | ORAL | 3 refills | Status: AC

## 2023-11-20 MED ORDER — METOPROLOL TARTRATE 100 MG PO TABS: ORAL_TABLET | ORAL | 0 refills | Status: DC

## 2023-11-20 NOTE — Patient Instructions (Addendum)
 Medication Instructions:  Your physician has recommended you make the following change in your medication:  START: carvedilol  (Coreg ) 3.125 mg by mouth twice daily  *If you need a refill on your cardiac medications before your next appointment, please call your pharmacy*  Lab Work: BMP at [facility] on 1st floor  If you have labs (blood work) drawn today and your tests are completely normal, you will receive your results only by: MyChart Message (if you have MyChart) OR A paper copy in the mail If you have any lab test that is abnormal or we need to change your treatment, we will call you to review the results.  Testing/Procedures: Your physician has requested that you have an echocardiogram. Echocardiography is a painless test that uses sound waves to create images of your heart. It provides your doctor with information about the size and shape of your heart and how well your heart's chambers and valves are working. This procedure takes approximately one hour. There are no restrictions for this procedure. Please do NOT wear cologne, perfume, aftershave, or lotions (deodorant is allowed). Please arrive 15 minutes prior to your appointment time.  Please note: We ask at that you not bring children with you during ultrasound (echo/ vascular) testing. Due to room size and safety concerns, children are not allowed in the ultrasound rooms during exams. Our front office staff cannot provide observation of children in our lobby area while testing is being conducted. An adult accompanying a patient to their appointment will only be allowed in the ultrasound room at the discretion of the ultrasound technician under special circumstances. We apologize for any inconvenience.   Your physician has requested that you have cardiac CT. Cardiac computed tomography (CT) is a painless test that uses an x-ray machine to take clear, detailed pictures of your heart. For further information please visit  [email or web address] Please follow instruction sheet as given.    Follow-Up:As needed At [facility], you and your health needs are our priority.  As part of our continuing mission to provide you with exceptional heart care, our providers are all part of one team.  This team includes your primary Cardiologist (physician) and Advanced Practice Providers or APPs (Physician Assistants and Nurse Practitioners) who all work together to provide you with the care you need, when you need it.    Provider:   Gloriann Larger, MD    Other Instructions    Your cardiac CT will be scheduled at    [facility] [address]  [place], [place] [address] Opening October 30, 2023   If scheduled at the Heart and Vascular Tower at [employer], please enter the parking lot using the [address] street entrance and use the FREE valet service at the patient drop-off area. Enter the buidling and check-in with registration on the main floor.   Please follow these instructions carefully (unless otherwise directed):  An IV will be required for this test and Nitroglycerin will be given.   On the Night Before the Test: Be sure to Drink plenty of water. Do not consume any caffeinated/decaffeinated beverages or chocolate 12 hours prior to your test. Do not take any antihistamines 12 hours prior to your test.  On the Day of the Test: Drink plenty of water until 1 hour prior to the test. Do not eat any food 1 hour prior to test. You may take your regular medications prior to the test.  Take metoprolol  (Lopressor ) 100 mg two hours prior  to test. Patients who wear a continuous glucose monitor MUST remove the device prior to scanning. FEMALES- please wear underwire-free bra if available, avoid dresses & tight clothing      After the Test: Drink plenty of water. After receiving IV contrast, you may experience a mild flushed feeling. This is normal. On occasion, you  may experience a mild rash up to 24 hours after the test. This is not dangerous. If this occurs, you can take Benadryl 25 mg, Zyrtec, Claritin, or Allegra and increase your fluid intake. (Patients taking Tikosyn should avoid Benadryl, and may take Zyrtec, Claritin, or Allegra) If you experience trouble breathing, this can be serious. If it is severe call 911 IMMEDIATELY. If it is mild, please call our office.  We will call to schedule your test 2-4 weeks out understanding that some insurance companies will need an authorization prior to the service being performed.   For more information and frequently asked questions, please visit our website : [email or web address]  For non-scheduling related questions, please contact the cardiac imaging nurse navigator should you have any questions/concerns: Cardiac Imaging Nurse Navigators Direct Office Dial: [phone]   For scheduling needs, including cancellations and rescheduling, please call [place], [phone].

## 2023-11-20 NOTE — Progress Notes (Signed)
 Cardiology Office Note:  .    Date:  11/20/2023  ID:  Angel Barba, DOB 28-Dec-1970, MRN [id number] PCP: Sun, Vyvyan, MD  [facility] Providers Cardiologist:  None     CC: Chest pain Consulted for the evaluation of CP at the behest of Dr. Clearnce Curia   History of Present Illness: .    MCKYLA DECKMAN is a 53 y.o. female with hypertension and hyperlipidemia who presents with chest discomfort and shortness of breath. She was referred by her primary care doctor for evaluation of chest discomfort and shortness of breath.  She experiences chest discomfort radiating to her throat, particularly during physical exertion such as walking up steps or attempting to walk quickly. This discomfort has been present for about a year and has progressively worsened, now limiting her ability to perform regular activities like walking around a track or downtown with her husband. She describes the sensation as a burning or hurting feeling that necessitates stopping to rest.  She also experiences shortness of breath accompanying the chest discomfort, significantly impacting her daily life. She can no longer engage in activities she previously enjoyed, such as going to the gym or walking with her husband.  Her blood pressure has been slightly elevated, with recent readings around 130 to 140 mmHg. She has a history of hyperlipidemia and was recently started on aspirin. There is a family history of atrial fibrillation, but no personal history of coronary artery disease.  Her symptoms have been persistent despite her efforts to maintain physical activity, and she is concerned about her inability to perform normal activities without experiencing discomfort.  Discussed the use of AI scribe software for clinical note transcription with the patient, who gave verbal consent to proceed.  Relevant histories: .  Social  - Family history of atrial fibrillation (father) - grandchild was just born ROS: As per  HPI.  Physical Exam:    VS:  BP (!) 140/84 (BP Location: Right Arm)   Pulse 76   Ht 5\' 5"  (1.651 m)   Wt 171 lb (77.6 kg)   LMP 10/27/2022 (Exact Date)   SpO2 97%   BMI 28.46 kg/m    Wt Readings from Last 3 Encounters:  11/20/23 171 lb (77.6 kg)  11/02/23 173 lb 4 oz (78.6 kg)  11/09/22 164 lb 12.8 oz (74.8 kg)    Gen: no distress   Neck: No JVD Cardiac: No Rubs or Gallops, systolic murmur, RRR +2radial pulses Respiratory: Clear to auscultation bilaterally, normal effort, normal  respiratory rate GI: Soft, nontender, non-distended  MS: No  edema;  moves all extremities Integument: Skin feels warm Neuro:  At time of evaluation, alert and oriented to person/place/time/situation  Psych: Normal affect, patient feels ok   ASSESSMENT AND PLAN: .    An EKG was reportedly normal at PCP office; we have requested this; a fax was attempted 11/13/23  Chest pain on exertion Progressive chest pain on exertion over the past year, radiating to the throat, associated with shortness of breath. Differential diagnosis includes coronary artery disease, hypertension-related issues, or non-cardiac causes. Priority is to rule out coronary artery disease due to risk of blockages. She prefers thorough investigation to identify the cause of symptoms. - Order cardiac CT to assess for coronary artery disease - Order echocardiogram to evaluate heart function and investigate heart murmur  Heart murmur Detected heart murmur, possibly related to elevated blood pressure. Further evaluation needed to determine significance. - Order echocardiogram to evaluate heart murmur  Hypertension Blood  pressure slightly elevated, around 130-140 mmHg. Plan to manage with medication to lower blood pressure and heart rate. Discussed potential side effects of medication, including increased sensitivity to alcohol, which may cause increased intoxication with alcohol consumption. - Prescribe Coreg  3.25 mg PO BID to lower blood  pressure and heart rate  Hyperlipidemia Cholesterol levels slightly elevated. Potential for more aggressive treatment if coronary artery disease is identified.  PRN unless we uncover cardiac disease Patient to start MyChart   Gloriann Larger, MD FASE [facility] Cardiologist [facility]  [address] [place], [address] [place], [place] [address] [phone]  3:29 PM

## 2023-12-27 ENCOUNTER — Ambulatory Visit (HOSPITAL_COMMUNITY)
Admission: RE | Admit: 2023-12-27 | Discharge: 2023-12-27 | Disposition: A | Discharge status: Home / Self Care | Source: Ambulatory Visit | Attending: Cardiology | Admitting: Cardiology

## 2023-12-27 DIAGNOSIS — R079 Chest pain, unspecified: Secondary | ICD-10-CM

## 2023-12-27 DIAGNOSIS — R011 Cardiac murmur, unspecified: Secondary | ICD-10-CM | POA: Diagnosis not present

## 2023-12-27 LAB — ECHOCARDIOGRAM COMPLETE
Area-P 1/2: 3.91 cm2
S' Lateral: 2.6 cm

## 2023-12-28 ENCOUNTER — Other Ambulatory Visit: Payer: Self-pay

## 2023-12-28 ENCOUNTER — Ambulatory Visit: Attending: Obstetrics and Gynecology | Admitting: Physical Therapy

## 2023-12-28 ENCOUNTER — Encounter: Payer: Self-pay | Admitting: Physical Therapy

## 2023-12-28 ENCOUNTER — Ambulatory Visit: Payer: Self-pay | Admitting: Internal Medicine

## 2023-12-28 DIAGNOSIS — R252 Cramp and spasm: Secondary | ICD-10-CM | POA: Insufficient documentation

## 2023-12-28 DIAGNOSIS — R278 Other lack of coordination: Secondary | ICD-10-CM | POA: Diagnosis not present

## 2023-12-28 DIAGNOSIS — N941 Unspecified dyspareunia: Secondary | ICD-10-CM | POA: Diagnosis not present

## 2023-12-28 NOTE — Therapy (Signed)
 OUTPATIENT PHYSICAL THERAPY FEMALE PELVIC EVALUATION   Patient Name: Danielle [name] MRN: [id number] DOB:Feb 17, 1971, 53 y.o., female Today's Date: 12/28/2023  END OF SESSION:  PT End of Session - 12/28/23 0836     Visit Number 1    Date for PT Re-Evaluation 06/28/24    Authorization Type bcbs 2025  no auth req- checked in [place]lon   bcbs    PT Start Time 0804    PT Stop Time 0845    PT Time Calculation (min) 41 min    Activity Tolerance Patient tolerated treatment well    Behavior During Therapy [facility] for tasks assessed/performed          Past Medical History:  Diagnosis Date   Bacterial vaginosis    RECURRENT   Difficult intravenous access    Difficult IV stick.   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   HSV (herpes simplex virus) anogenital infection    Migraines    takes OTC   Pre-diabetes 2019   04/17/18 HgbA1c 6.2   Wears glasses    readers only   Past Surgical History:  Procedure Laterality Date   BREAST BIOPSY Right 01/21/2022   right breast core needle biopsy / pathology=fibroadenoma, neg for malignancy   CESAREAN SECTION  12/26/2000   CHOLECYSTECTOMY N/A 04/06/2019   Procedure: LAPAROSCOPIC CHOLECYSTECTOMY;  Surgeon: Vanderbilt Ned, MD;  Location: [facility];  Service: General;  Laterality: N/A;   CYST EXCISION N/A 04/21/2017   Procedure: EXCISION OF SEBACEOUS CYST OF BILATERAL THIGHS AND LEFT LABIA;  Surgeon: Signe Mitzie LABOR, MD;  Location: [facility];  Service: General;  Laterality: N/A;   CYSTOSCOPY  11/09/2022   Procedure: CYSTOSCOPY;  Surgeon: Storm Setter, DO;  Location: [facility] [place];  Service: Gynecology;;   INCISE AND DRAIN ABCESS  09/23/2009   LEFT LABIAL-SKENE    LAPAROTOMY  11/09/2022   Procedure: GELPORT ASSISTED MINI LAPAROTOMY;  Surgeon: Storm Setter, DO;  Location: [facility];  Service: Gynecology;;   ROBOTIC ASSISTED LAPAROSCOPIC HYSTERECTOMY AND SALPINGECTOMY Bilateral 11/09/2022    Procedure: XI ROBOTIC ASSISTED LAPAROSCOPIC HYSTERECTOMY AND SALPINGECTOMY;  Surgeon: Storm Setter, DO;  Location: [facility];  Service: Gynecology;  Laterality: Bilateral;   TUBAL LIGATION  07/04/2007   Patient Active Problem List   Diagnosis Date Noted   Chest pain of uncertain etiology 11/20/2023   Heart murmur 11/20/2023   Mixed hyperlipidemia 11/20/2023   Essential hypertension 11/20/2023   Fibroid uterus 11/09/2022   Cholecystitis 04/05/2019   H/O tubal ligation 05/05/2015   HSV (herpes simplex virus) anogenital infection 01/28/2013    PCP: Sun, Vyvyan, MD  REFERRING PROVIDER: Jeralyn Crutch, MD  REFERRING DIAG: N94.10 (ICD-10-CM) - Dyspareunia in female  THERAPY DIAG:  Other lack of coordination  Cramp and spasm  Rationale for Evaluation and Treatment: Rehabilitation  ONSET DATE: 5/ 2024 after she had hysterectomy  SUBJECTIVE:  SUBJECTIVE STATEMENT: Pt reports that after she had a hysterectomy her pee drops when she dances or jumps. Pain with intercourse since then as well.  Had fibroids and heavy bleeding Pt does not know if she has her ovaries.  She is not sure if she is glad she had her surgery ut she is glad she is not bleeding anymore. Has never had PT, is not sure about what pelvic PT is.    Fluid intake: soda  PAIN:  Are you having pain? No  PRECAUTIONS: None  RED FLAGS: None   WEIGHT BEARING RESTRICTIONS: No  FALLS:  Has patient fallen in last 6 months? 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  ACTIVITY LEVEL : not really active- more active before  PLOF: Independent  PATIENT GOALS: not  PERTINENT HISTORY:  Hysterectomy- 2024 Sexual abuse: No  BOWEL MOVEMENT: no issues   URINATION: Pain with urination: No Fully empty  bladder: No- stands up and leaks sometimes Stream: Strong Urgency: Yes  Frequency: yes Leakage: Coughing, Sneezing, Laughing, and Exercise Pads: No  INTERCOURSE:  Ability to have vaginal penetration Yes   Pain with intercourse: Deep Penetration DrynessNo Climax: yes Marinoff Scale: 1/3 Laxative:  PREGNANCY: Vaginal deliveries 2 Tearing Yes: thinks so Episiotomy No C-section deliveries 1 Currently pregnant No  PROLAPSE: None   OBJECTIVE:  Note: Objective measures were completed at Evaluation unless otherwise noted.   PATIENT SURVEYS:    PFIQ-7: 13  COGNITION: Overall cognitive status: Within functional limits for tasks assessed     SENSATION: Light touch: Appears intact  LUMBAR SPECIAL TESTS:  Straight leg raise test: Negative    GAIT: Assistive device utilized: None Comments: within functional limitations   POSTURE: rounded shoulders and forward head   LUMBARAROM/PROM: full   LOWER EXTREMITY ROM: full  LOWER EXTREMITY MMT: 4/5  PALPATION:   General: upper chest breathing, TPs throughout bilateral upper traps  Pelvic Alignment: even  Abdominal: tender scars throughout- several little ones and 2 larger ones                External Perineal Exam: to be assessed                             Internal Pelvic Floor: to be assessed  Patient confirms identification and approves PT to assess internal pelvic floor and treatment yes- next visit  PELVIC MMT:   MMT eval  Vaginal   Internal Anal Sphincter   External Anal Sphincter   Puborectalis   Diastasis Recti no  (Blank rows = not tested)        TONE: to be assessed  PROLAPSE: to be assessed  TODAY'S TREATMENT:                                                                                                                              DATE: 12/28/2023   EVAL EVAL Examination completed, findings  reviewed, pt educated on POC, HEP, and female pelvic floor anatomy, reasoning with pelvic floor  assessment internally with pt consent. Pt motivated to participate in PT and agreeable to attempt recommendations.     PATIENT EDUCATION/ there acts:  Education details: Pt was educated on relevant anatomy, exam findings, HEP, expectations of PT , bladder irritant ( soda), oh nuts  Person educated: Patient Education method: Explanation, Demonstration, Tactile cues, Verbal cues, and Handouts Education comprehension: verbalized understanding, returned demonstration, verbal cues required, tactile cues required, and needs further education  HOME EXERCISE PROGRAM: Scar massage  Diaphragmatic breathing  ASSESSMENT:  CLINICAL IMPRESSION: Patient is a 53 y.o. F who was seen today for physical therapy evaluation and treatment for pain with intercourse and stress urinary incontinence.Pt with tight and tender and restricted abdominal scars, upper trap trigger points, chronic headaches. Her urinary incontinence started after she had her hysterectomy, pt has not been able to be very active. 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 She has deep pain with intercourse with husband as well, discussed use of ohnuts. She was educated on abdominal massage for  tight and tender abdominal scars.  She appeared somewhat hesitant about pelvic PT, did well with education. She will benefit from PT to address deficits.   OBJECTIVE IMPAIRMENTS: decreased activity tolerance, decreased coordination, decreased endurance, decreased knowledge of condition, decreased ROM, decreased strength, hypomobility, increased fascial restrictions, increased muscle spasms, obesity, and pain.   ACTIVITY LIMITATIONS: continence and toileting  PARTICIPATION LIMITATIONS: interpersonal relationship and community activity  PERSONAL FACTORS: Time since onset of injury/illness/exacerbation are also affecting patient's functional outcome.   REHAB POTENTIAL: Good  CLINICAL DECISION MAKING: Evolving/moderate complexity  EVALUATION COMPLEXITY:  Moderate   GOALS: Goals reviewed with patient? Yes  SHORT TERM GOALS: Target date: 01/25/2024    Pt will be independent with HEP.   Baseline: Goal status: INITIAL  2.  Pt will be independent with the knack, urge suppression technique, and double voiding in order to improve bladder habits and decrease urinary incontinence.   Baseline:  Goal status: INITIAL  3.  Pt will be educated on vulvovaginal massage  Baseline:  Goal status: INITIAL   LONG TERM GOALS: Target date: 06/28/2024  Pt will be independent with advanced HEP.   Baseline:  Goal status: INITIAL  2.  Pt will be independent with abdominal scar massage and cupping in order to reduce pain and improve muscle activation and muscle ability to contract and relax in order to have better mobility and transfers.  Baseline:  Goal status: INITIAL  3.   Pt will be able to dem 20 squats and 20 jumps without leaking to participate in a regular exercise program  Baseline:  Goal status: INITIAL  4.  Pt will demonstrate at least 20 point improvement in PFIQ-7 score in order to show functional improvement in urinary incontinence.   Baseline: 62 Goal status: INITIAL  5.  Pt will report 0/10 pain with vaginal penetration in order to improve intimate relationship with partner.    Baseline:  Goal status: INITIAL    PLAN:  PT FREQUENCY: 1-2x/week  PT DURATION: 6 months  PLANNED INTERVENTIONS: 97110-Therapeutic exercises, 97530- Therapeutic activity, 97112- Neuromuscular re-education, 97535- Self Care, [id number]- Manual therapy, [phone]- Electrical stimulation (manual), [phone] (1-2 muscles), 20561 (3+ muscles)- Dry Needling, Patient/Family education, Taping, Joint mobilization, Joint manipulation, Spinal manipulation, Spinal mobilization, Scar mobilization, Cryotherapy, Moist heat, and Biofeedback  PLAN FOR NEXT SESSION: abdominal scar massage, pelvic floor internal muscle assessment, core exercises for  SUI, upper trap dry  needling   Samir Ishaq, PT 12/28/2023, 8:37 AM

## 2024-01-15 ENCOUNTER — Ambulatory Visit: Admitting: Physical Therapy

## 2024-01-19 ENCOUNTER — Ambulatory Visit: Admitting: Physical Therapy

## 2024-01-29 ENCOUNTER — Encounter: Payer: Self-pay | Admitting: Internal Medicine

## 2024-02-01 DIAGNOSIS — N898 Other specified noninflammatory disorders of vagina: Secondary | ICD-10-CM | POA: Diagnosis not present

## 2024-02-01 DIAGNOSIS — G479 Sleep disorder, unspecified: Secondary | ICD-10-CM | POA: Diagnosis not present

## 2024-02-01 DIAGNOSIS — Z01419 Encounter for gynecological examination (general) (routine) without abnormal findings: Secondary | ICD-10-CM | POA: Diagnosis not present

## 2024-02-07 DIAGNOSIS — I10 Essential (primary) hypertension: Secondary | ICD-10-CM | POA: Diagnosis not present

## 2024-02-07 DIAGNOSIS — R0609 Other forms of dyspnea: Secondary | ICD-10-CM | POA: Diagnosis not present

## 2024-02-07 DIAGNOSIS — G43009 Migraine without aura, not intractable, without status migrainosus: Secondary | ICD-10-CM | POA: Diagnosis not present

## 2024-02-15 ENCOUNTER — Ambulatory Visit: Attending: Obstetrics and Gynecology | Admitting: Physical Therapy

## 2024-02-15 DIAGNOSIS — R252 Cramp and spasm: Secondary | ICD-10-CM | POA: Insufficient documentation

## 2024-02-15 DIAGNOSIS — R278 Other lack of coordination: Secondary | ICD-10-CM | POA: Diagnosis not present

## 2024-02-15 NOTE — Therapy (Signed)
 OUTPATIENT PHYSICAL THERAPY FEMALE PELVIC TREATMENT   Patient Name: Danielle [name] MRN: [id number] DOB:1970/09/26, 53 y.o., female Today's Date: 02/15/2024  END OF SESSION:  PT End of Session - 02/15/24 0749     Visit Number 2    Date for PT Re-Evaluation 06/28/24    Authorization Type bcbs [id number]  no auth req- checked in [employer]lon   bcbs    PT Start Time 0730    PT Stop Time 0758    PT Time Calculation (min) 28 min    Activity Tolerance Patient tolerated treatment well    Behavior During Therapy [facility] for tasks assessed/performed           Past Medical History:  Diagnosis Date   Bacterial vaginosis    RECURRENT   Difficult intravenous access    Difficult IV stick.   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   HSV (herpes simplex virus) anogenital infection    Migraines    takes OTC   Pre-diabetes 2019   04/17/18 HgbA1c 6.2   Wears glasses    readers only   Past Surgical History:  Procedure Laterality Date   BREAST BIOPSY Right 01/21/2022   right breast core needle biopsy / pathology=fibroadenoma, neg for malignancy   CESAREAN SECTION  12/26/2000   CHOLECYSTECTOMY N/A 04/06/2019   Procedure: LAPAROSCOPIC CHOLECYSTECTOMY;  Surgeon: Vanderbilt Ned, MD;  Location: [facility];  Service: General;  Laterality: N/A;   CYST EXCISION N/A 04/21/2017   Procedure: EXCISION OF SEBACEOUS CYST OF BILATERAL THIGHS AND LEFT LABIA;  Surgeon: Signe Mitzie LABOR, MD;  Location: [facility];  Service: General;  Laterality: N/A;   CYSTOSCOPY  11/09/2022   Procedure: CYSTOSCOPY;  Surgeon: Storm Setter, DO;  Location: [facility];  Service: Gynecology;;   INCISE AND DRAIN ABCESS  09/23/2009   LEFT LABIAL-SKENE    LAPAROTOMY  11/09/2022   Procedure: GELPORT ASSISTED MINI LAPAROTOMY;  Surgeon: Storm Setter, DO;  Location: [facility];  Service: Gynecology;;   ROBOTIC ASSISTED LAPAROSCOPIC HYSTERECTOMY AND SALPINGECTOMY Bilateral 11/09/2022    Procedure: XI ROBOTIC ASSISTED LAPAROSCOPIC HYSTERECTOMY AND SALPINGECTOMY;  Surgeon: Storm Setter, DO;  Location: [facility];  Service: Gynecology;  Laterality: Bilateral;   TUBAL LIGATION  07/04/2007   Patient Active Problem List   Diagnosis Date Noted   Chest pain of uncertain etiology 11/20/2023   Heart murmur 11/20/2023   Mixed hyperlipidemia 11/20/2023   Essential hypertension 11/20/2023   Fibroid uterus 11/09/2022   Cholecystitis 04/05/2019   H/O tubal ligation 05/05/2015   HSV (herpes simplex virus) anogenital infection 01/28/2013    PCP: Sun, Vyvyan, MD  REFERRING PROVIDER: Jeralyn Crutch, MD  REFERRING DIAG: N94.10 (ICD-10-CM) - Dyspareunia in female  THERAPY DIAG:  Other lack of coordination  Cramp and spasm  Rationale for Evaluation and Treatment: Rehabilitation  ONSET DATE: 5/ 2024 after she had hysterectomy  SUBJECTIVE:  SUBJECTIVE STATEMENT: Patient reports she is doing well today, a little bit of right sided hip pain this morning. She feels like she pees all the time, no leakage though. Getting up 2x/night. Fully consents to today's internal vaginal exam.   From eval: Pt reports that after she had a hysterectomy her pee drops when she dances or jumps. Pain with intercourse since then as well.  Had fibroids and heavy bleeding Pt does not know if she has her ovaries.  She is not sure if she is glad she had her surgery ut she is glad she is not bleeding anymore. Has never had PT, is not sure about what pelvic PT is.    Fluid intake: soda  PAIN:  Are you having pain? No  PRECAUTIONS: None  RED FLAGS: None   WEIGHT BEARING RESTRICTIONS: No  FALLS:  Has patient fallen in last 6 months? 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  ACTIVITY LEVEL  : not really active- more active before  PLOF: Independent  PATIENT GOALS: not  PERTINENT HISTORY:  Hysterectomy- 2024 Sexual abuse: No  BOWEL MOVEMENT: no issues   URINATION: Pain with urination: No Fully empty bladder: No- stands up and leaks sometimes Stream: Strong Urgency: Yes  Frequency: yes Leakage: Coughing, Sneezing, Laughing, and Exercise Pads: No  INTERCOURSE:  Ability to have vaginal penetration Yes   Pain with intercourse: Deep Penetration DrynessNo Climax: yes Marinoff Scale: 1/3 Laxative:  PREGNANCY: Vaginal deliveries 2 Tearing Yes: thinks so Episiotomy No C-section deliveries 1 Currently pregnant No  PROLAPSE: None   OBJECTIVE:  Note: Objective measures were completed at Evaluation unless otherwise noted.   PATIENT SURVEYS:    PFIQ-7: 45  COGNITION: Overall cognitive status: Within functional limits for tasks assessed     SENSATION: Light touch: Appears intact  LUMBAR SPECIAL TESTS:  Straight leg raise test: Negative    GAIT: Assistive device utilized: None Comments: within functional limitations   POSTURE: rounded shoulders and forward head   LUMBARAROM/PROM: full   LOWER EXTREMITY ROM: full  LOWER EXTREMITY MMT: 4/5  PALPATION:   General: upper chest breathing, TPs throughout bilateral upper traps  Pelvic Alignment: even  Abdominal: tender scars throughout- several little ones and 2 larger ones                External Perineal Exam: ...                            Internal Pelvic Floor: ...  Patient confirms identification and approves PT to assess internal pelvic floor and treatment yes- next visit No emotional/communication barriers or cognitive limitation. Patient is motivated to learn. Patient understands and agrees with treatment goals and plan. PT explains patient will be examined in standing, sitting, and lying down to see how their muscles and joints work. When they are ready, they will be asked to remove  their underwear so PT can examine their perineum. The patient is also given the option of providing their own chaperone as one is not provided in our facility. The patient also has the right and is explained the right to defer or refuse any part of the evaluation or treatment including the internal exam. With the patient's consent, PT will use one gloved finger to gently assess the muscles of the pelvic floor, seeing how well it contracts and relaxes and if there is muscle symmetry. After, the patient will get dressed and PT and patient will discuss  exam findings and plan of care. PT and patient discuss plan of care, schedule, attendance policy and HEP activities.   PELVIC MMT:   MMT eval  Vaginal 2/5, 3 quick flicks, 1 sec hold  Internal Anal Sphincter   External Anal Sphincter   Puborectalis   Diastasis Recti no  (Blank rows = not tested)        TONE: low  PROLAPSE: N/A  TODAY'S TREATMENT:                                                                                                                              DATE: 12/28/2023   EVAL EVAL Examination completed, findings reviewed, pt educated on POC, HEP, and female pelvic floor anatomy, reasoning with pelvic floor assessment internally with pt consent. Pt motivated to participate in PT and agreeable to attempt recommendations.    02/15/24: Manual therapy/neuro re-ed/self care: Internal vaginal examination of pelvic floor musculature superficially and deep  Hooklying diaphragmatic breathing + pelvic floor lengthening with inhalation and shortening with exhalation 2x10  Hooklying diaphragmatic breathing + pelvic floor quick flick contractions 2x10  Education for pelvic floor AROM training and how this affects stiffness in musculature   PATIENT EDUCATION/ there acts:  Education details: Pt was educated on relevant anatomy, exam findings, HEP, expectations of PT , bladder irritant ( soda), oh nuts  Person educated: Patient Education  method: Explanation, Demonstration, Tactile cues, Verbal cues, and Handouts Education comprehension: verbalized understanding, returned demonstration, verbal cues required, tactile cues required, and needs further education  HOME EXERCISE PROGRAM: Scar massage  Diaphragmatic breathing  Access Code: [email or web address] URL: [email or web address] Date: 02/15/2024 Prepared by: Celena Domino  Exercises - Supine Pelvic Floor Contraction  - 1 x daily - 7 x weekly - 2 sets - 10 reps - Quick Flick Pelvic Floor Contractions in Hooklying  - 1 x daily - 7 x weekly - 2 sets - 10 reps  ASSESSMENT:  CLINICAL IMPRESSION: Patient is a 53 y.o. F who was seen today for physical therapy treatment for pain with intercourse and stress urinary incontinence. Patient fully consents to today's internal examination vaginally. She demosntrates a lack of coordination and weakness in the pelvic floor, both superficially and deep. AROM training introduced in hooklying with endurance training as well. No pain at end of session. She will benefit from PT to address deficits.   OBJECTIVE IMPAIRMENTS: decreased activity tolerance, decreased coordination, decreased endurance, decreased knowledge of condition, decreased ROM, decreased strength, hypomobility, increased fascial restrictions, increased muscle spasms, obesity, and pain.   ACTIVITY LIMITATIONS: continence and toileting  PARTICIPATION LIMITATIONS: interpersonal relationship and community activity  PERSONAL FACTORS: Time since onset of injury/illness/exacerbation are also affecting patient's functional outcome.   REHAB POTENTIAL: Good  CLINICAL DECISION MAKING: Evolving/moderate complexity  EVALUATION COMPLEXITY: Moderate   GOALS: Goals reviewed with patient? Yes  SHORT TERM GOALS: Target date: 01/25/2024    Pt will be independent with HEP.   Baseline: Goal status: INITIAL  2.  Pt will be independent with the knack, urge suppression technique, and  double voiding in order to improve bladder habits and decrease urinary incontinence.   Baseline:  Goal status: INITIAL  3.  Pt will be educated on vulvovaginal massage  Baseline:  Goal status: INITIAL   LONG TERM GOALS: Target date: 06/28/2024  Pt will be independent with advanced HEP.   Baseline:  Goal status: INITIAL  2.  Pt will be independent with abdominal scar massage and cupping in order to reduce pain and improve muscle activation and muscle ability to contract and relax in order to have better mobility and transfers.  Baseline:  Goal status: INITIAL  3.   Pt will be able to dem 20 squats and 20 jumps without leaking to participate in a regular exercise program  Baseline:  Goal status: INITIAL  4.  Pt will demonstrate at least 20 point improvement in PFIQ-7 score in order to show functional improvement in urinary incontinence.   Baseline: 62 Goal status: INITIAL  5.  Pt will report 0/10 pain with vaginal penetration in order to improve intimate relationship with partner.    Baseline:  Goal status: INITIAL    PLAN:  PT FREQUENCY: 1-2x/week  PT DURATION: 6 months  PLANNED INTERVENTIONS: 97110-Therapeutic exercises, 97530- Therapeutic activity, 97112- Neuromuscular re-education, 97535- Self Care, 02859- Manual therapy, [phone]- Electrical stimulation (manual), [phone] (1-2 muscles), 20561 (3+ muscles)- Dry Needling, Patient/Family education, Taping, Joint mobilization, Joint manipulation, Spinal manipulation, Spinal mobilization, Scar mobilization, Cryotherapy, Moist heat, and Biofeedback  PLAN FOR NEXT SESSION: abdominal scar massage, pelvic floor internal muscle assessment, core exercises for SUI, upper trap dry needling   Celena JAYSON Domino, PT 02/15/2024, 7:50 AM

## 2024-02-19 ENCOUNTER — Ambulatory Visit: Admitting: Physical Therapy

## 2024-02-20 ENCOUNTER — Encounter: Admitting: Physical Therapy

## 2024-02-26 ENCOUNTER — Encounter: Admitting: Physical Therapy

## 2024-02-27 ENCOUNTER — Ambulatory Visit: Admitting: Physical Therapy

## 2024-02-27 DIAGNOSIS — R278 Other lack of coordination: Secondary | ICD-10-CM | POA: Diagnosis not present

## 2024-02-27 DIAGNOSIS — R252 Cramp and spasm: Secondary | ICD-10-CM

## 2024-02-27 NOTE — Patient Instructions (Signed)

## 2024-02-27 NOTE — Therapy (Signed)
 OUTPATIENT PHYSICAL THERAPY FEMALE PELVIC TREATMENT   Patient Name: Danielle [name] MRN: [id number] DOB:Nov 30, 1970, 53 y.o., female Today's Date: 02/27/2024  END OF SESSION:  PT End of Session - 02/27/24 0807     Visit Number 3    Date for PT Re-Evaluation 06/28/24    Authorization Type bcbs 2025  no auth req- checked in [facility]   bcbs    PT Start Time 0804    PT Stop Time 0845    PT Time Calculation (min) 41 min    Activity Tolerance Patient tolerated treatment well    Behavior During Therapy [facility] for tasks assessed/performed           Past Medical History:  Diagnosis Date   Bacterial vaginosis    RECURRENT   Difficult intravenous access    Difficult IV stick.   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   HSV (herpes simplex virus) anogenital infection    Migraines    takes OTC   Pre-diabetes 2019   04/17/18 HgbA1c 6.2   Wears glasses    readers only   Past Surgical History:  Procedure Laterality Date   BREAST BIOPSY Right 01/21/2022   right breast core needle biopsy / pathology=fibroadenoma, neg for malignancy   CESAREAN SECTION  12/26/2000   CHOLECYSTECTOMY N/A 04/06/2019   Procedure: LAPAROSCOPIC CHOLECYSTECTOMY;  Surgeon: Vanderbilt Ned, MD;  Location: [facility];  Service: General;  Laterality: N/A;   CYST EXCISION N/A 04/21/2017   Procedure: EXCISION OF SEBACEOUS CYST OF BILATERAL THIGHS AND LEFT LABIA;  Surgeon: Signe Mitzie LABOR, MD;  Location: [facility];  Service: General;  Laterality: N/A;   CYSTOSCOPY  11/09/2022   Procedure: CYSTOSCOPY;  Surgeon: Storm Setter, DO;  Location: [facility];  Service: Gynecology;;   INCISE AND DRAIN ABCESS  09/23/2009   LEFT LABIAL-SKENE    LAPAROTOMY  11/09/2022   Procedure: GELPORT ASSISTED MINI LAPAROTOMY;  Surgeon: Storm Setter, DO;  Location: [facility];  Service: Gynecology;;   ROBOTIC ASSISTED LAPAROSCOPIC HYSTERECTOMY AND SALPINGECTOMY Bilateral 11/09/2022    Procedure: XI ROBOTIC ASSISTED LAPAROSCOPIC HYSTERECTOMY AND SALPINGECTOMY;  Surgeon: Storm Setter, DO;  Location: [facility];  Service: Gynecology;  Laterality: Bilateral;   TUBAL LIGATION  07/04/2007   Patient Active Problem List   Diagnosis Date Noted   Chest pain of uncertain etiology 11/20/2023   Heart murmur 11/20/2023   Mixed hyperlipidemia 11/20/2023   Essential hypertension 11/20/2023   Fibroid uterus 11/09/2022   Cholecystitis 04/05/2019   H/O tubal ligation 05/05/2015   HSV (herpes simplex virus) anogenital infection 01/28/2013    PCP: Sun, Vyvyan, MD  REFERRING PROVIDER: Jeralyn Crutch, MD  REFERRING DIAG: N94.10 (ICD-10-CM) - Dyspareunia in female  THERAPY DIAG:  Other lack of coordination  Cramp and spasm  Rationale for Evaluation and Treatment: Rehabilitation  ONSET DATE: 5/ 2024 after she had hysterectomy  SUBJECTIVE:  SUBJECTIVE STATEMENT: Pt reports some improvement in leakage but frequency the same and pain the same.  From eval: Pt reports that after she had a hysterectomy her pee drops when she dances or jumps. Pain with intercourse since then as well.  Had fibroids and heavy bleeding Pt does not know if she has her ovaries.  She is not sure if she is glad she had her surgery ut she is glad she is not bleeding anymore. Has never had PT, is not sure about what pelvic PT is.    Fluid intake: soda  PAIN:  Are you having pain? No  PRECAUTIONS: None  RED FLAGS: None   WEIGHT BEARING RESTRICTIONS: No  FALLS:  Has patient fallen in last 6 months? 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  ACTIVITY LEVEL : not really active- more active before  PLOF: Independent  PATIENT GOALS: not  PERTINENT HISTORY:  Hysterectomy- 2024 Sexual  abuse: No  BOWEL MOVEMENT: no issues   URINATION: Pain with urination: No Fully empty bladder: No- stands up and leaks sometimes Stream: Strong Urgency: Yes  Frequency: yes Leakage: Coughing, Sneezing, Laughing, and Exercise Pads: No  INTERCOURSE:  Ability to have vaginal penetration Yes   Pain with intercourse: Deep Penetration DrynessNo Climax: yes Marinoff Scale: 1/3 Laxative:  PREGNANCY: Vaginal deliveries 2 Tearing Yes: thinks so Episiotomy No C-section deliveries 1 Currently pregnant No  PROLAPSE: None   OBJECTIVE:  Note: Objective measures were completed at Evaluation unless otherwise noted.   PATIENT SURVEYS:    PFIQ-7: 28  COGNITION: Overall cognitive status: Within functional limits for tasks assessed     SENSATION: Light touch: Appears intact  LUMBAR SPECIAL TESTS:  Straight leg raise test: Negative    GAIT: Assistive device utilized: None Comments: within functional limitations   POSTURE: rounded shoulders and forward head   LUMBARAROM/PROM: full   LOWER EXTREMITY ROM: full  LOWER EXTREMITY MMT: 4/5  PALPATION:   General: upper chest breathing, TPs throughout bilateral upper traps  Pelvic Alignment: even  Abdominal: tender scars throughout- several little ones and 2 larger ones                External Perineal Exam: ...                            Internal Pelvic Floor: ...  Patient confirms identification and approves PT to assess internal pelvic floor and treatment yes- next visit No emotional/communication barriers or cognitive limitation. Patient is motivated to learn. Patient understands and agrees with treatment goals and plan. PT explains patient will be examined in standing, sitting, and lying down to see how their muscles and joints work. When they are ready, they will be asked to remove their underwear so PT can examine their perineum. The patient is also given the option of providing their own chaperone as one is not  provided in our facility. The patient also has the right and is explained the right to defer or refuse any part of the evaluation or treatment including the internal exam. With the patient's consent, PT will use one gloved finger to gently assess the muscles of the pelvic floor, seeing how well it contracts and relaxes and if there is muscle symmetry. After, the patient will get dressed and PT and patient will discuss exam findings and plan of care. PT and patient discuss plan of care, schedule, attendance policy and HEP activities.   PELVIC MMT:  MMT eval  Vaginal 2/5, 3 quick flicks, 1 sec hold  Internal Anal Sphincter   External Anal Sphincter   Puborectalis   Diastasis Recti no  (Blank rows = not tested)        TONE: low  PROLAPSE: N/A  TODAY'S TREATMENT:                                                                                                                              DATE: 12/28/2023   EVAL EVAL Examination completed, findings reviewed, pt educated on POC, HEP, and female pelvic floor anatomy, reasoning with pelvic floor assessment internally with pt consent. Pt motivated to participate in PT and agreeable to attempt recommendations.    02/15/24: Manual therapy/neuro re-ed/self care: Internal vaginal examination of pelvic floor musculature superficially and deep  Hooklying diaphragmatic breathing + pelvic floor lengthening with inhalation and shortening with exhalation 2x10  Hooklying diaphragmatic breathing + pelvic floor quick flick contractions 2x10  Education for pelvic floor AROM training and how this affects stiffness in musculature  02/27/24: Towel roll and ball squeeze to improve awareness of pelvic floor contractions x20 - max cues for coordination and introduction of breathing Seated pelvic floor contraction isometrics 10x5s - towel roll for improved awareness Manual - pt in supine with fascial release direct and indirect techniques in lower and Rt side of  abdominal quadrants for decreased tension, scar tissue mobility along hysterectomy scar (previous c-section as well) with notable tension throughout and traveling into Rt abdominal.   PATIENT EDUCATION/ there acts:  Education details: Pt was educated on relevant anatomy, exam findings, HEP, expectations of PT, bladder irritant ( soda), oh nuts  Person educated: Patient Education method: Explanation, Demonstration, Tactile cues, Verbal cues, and Handouts Education comprehension: verbalized understanding, returned demonstration, verbal cues required, tactile cues required, and needs further education  HOME EXERCISE PROGRAM: Scar massage  Diaphragmatic breathing  Access Code: [email or web address] URL: [email or web address] Date: 02/15/2024 Prepared by: Celena Domino  Exercises - Supine Pelvic Floor Contraction  - 1 x daily - 7 x weekly - 2 sets - 10 reps - Quick Flick Pelvic Floor Contractions in Hooklying  - 1 x daily - 7 x weekly - 2 sets - 10 reps  ASSESSMENT:  CLINICAL IMPRESSION: Patient is a 53 y.o. F who was seen today for physical therapy treatment for pain with intercourse and stress urinary incontinence. Pt reports continued pain with Rt hip and lower abdomen, did well with manual today and reports no tension at end of session. Pt reports she also felt better about pelvic floor activations after session today. She will benefit from PT to address deficits.   OBJECTIVE IMPAIRMENTS: decreased activity tolerance, decreased coordination, decreased endurance, decreased knowledge of condition, decreased ROM, decreased strength, hypomobility, increased fascial restrictions, increased muscle spasms, obesity, and pain.   ACTIVITY LIMITATIONS: continence and toileting  PARTICIPATION LIMITATIONS: interpersonal relationship and community activity  PERSONAL FACTORS: Time since onset of injury/illness/exacerbation are also affecting  patient's functional outcome.   REHAB POTENTIAL:  Good  CLINICAL DECISION MAKING: Evolving/moderate complexity  EVALUATION COMPLEXITY: Moderate   GOALS: Goals reviewed with patient? Yes  SHORT TERM GOALS: Target date: 01/25/2024    Pt will be independent with HEP.   Baseline: Goal status: INITIAL  2.  Pt will be independent with the knack, urge suppression technique, and double voiding in order to improve bladder habits and decrease urinary incontinence.   Baseline:  Goal status: INITIAL  3.  Pt will be educated on vulvovaginal massage  Baseline:  Goal status: INITIAL   LONG TERM GOALS: Target date: 06/28/2024  Pt will be independent with advanced HEP.   Baseline:  Goal status: INITIAL  2.  Pt will be independent with abdominal scar massage and cupping in order to reduce pain and improve muscle activation and muscle ability to contract and relax in order to have better mobility and transfers.  Baseline:  Goal status: INITIAL  3.   Pt will be able to dem 20 squats and 20 jumps without leaking to participate in a regular exercise program  Baseline:  Goal status: INITIAL  4.  Pt will demonstrate at least 20 point improvement in PFIQ-7 score in order to show functional improvement in urinary incontinence.   Baseline: 62 Goal status: INITIAL  5.  Pt will report 0/10 pain with vaginal penetration in order to improve intimate relationship with partner.    Baseline:  Goal status: INITIAL    PLAN:  PT FREQUENCY: 1-2x/week  PT DURATION: 6 months  PLANNED INTERVENTIONS: 97110-Therapeutic exercises, 97530- Therapeutic activity, 97112- Neuromuscular re-education, 97535- Self Care, 02859- Manual therapy, [phone]- Electrical stimulation (manual), [phone] (1-2 muscles), 20561 (3+ muscles)- Dry Needling, Patient/Family education, Taping, Joint mobilization, Joint manipulation, Spinal manipulation, Spinal mobilization, Scar mobilization, Cryotherapy, Moist heat, and Biofeedback  PLAN FOR NEXT SESSION: abdominal scar massage, core  exercises for SUI, upper trap dry needling  Darryle Navy, PT, DPT 08/26/259:17 AM  [facility] [address] [place], [place] [address] Phone # [phone] Fax [phone]

## 2024-02-29 ENCOUNTER — Other Ambulatory Visit: Payer: Self-pay | Admitting: Family Medicine

## 2024-02-29 DIAGNOSIS — N63 Unspecified lump in unspecified breast: Secondary | ICD-10-CM

## 2024-03-05 ENCOUNTER — Ambulatory Visit: Admitting: Physical Therapy

## 2024-03-08 ENCOUNTER — Ambulatory Visit
Admission: RE | Admit: 2024-03-08 | Discharge: 2024-03-08 | Disposition: A | Discharge status: Home / Self Care | Source: Ambulatory Visit | Attending: Family Medicine | Admitting: Family Medicine

## 2024-03-08 DIAGNOSIS — N63 Unspecified lump in unspecified breast: Secondary | ICD-10-CM

## 2024-03-08 DIAGNOSIS — N6312 Unspecified lump in the right breast, upper inner quadrant: Secondary | ICD-10-CM | POA: Diagnosis not present

## 2024-03-08 DIAGNOSIS — R928 Other abnormal and inconclusive findings on diagnostic imaging of breast: Secondary | ICD-10-CM | POA: Diagnosis not present

## 2024-03-12 ENCOUNTER — Encounter: Admitting: Physical Therapy

## 2024-03-20 ENCOUNTER — Ambulatory Visit: Admitting: Pulmonary Disease

## 2024-03-20 ENCOUNTER — Encounter: Payer: Self-pay | Admitting: Pulmonary Disease

## 2024-03-20 VITALS — BP 137/81 | HR 82 | Temp 98.5°F | Ht 65.0 in | Wt 175.2 lb

## 2024-03-20 DIAGNOSIS — J4540 Moderate persistent asthma, uncomplicated: Secondary | ICD-10-CM | POA: Diagnosis not present

## 2024-03-20 DIAGNOSIS — R0609 Other forms of dyspnea: Secondary | ICD-10-CM

## 2024-03-20 DIAGNOSIS — R079 Chest pain, unspecified: Secondary | ICD-10-CM

## 2024-03-20 MED ORDER — ALBUTEROL SULFATE HFA 108 (90 BASE) MCG/ACT IN AERS: 2.0000 | INHALATION_SPRAY | Freq: Four times a day (QID) | RESPIRATORY_TRACT | 6 refills | Status: AC | PRN

## 2024-03-20 MED ORDER — BUDESONIDE-FORMOTEROL FUMARATE 160-4.5 MCG/ACT IN AERO: 2.0000 | INHALATION_SPRAY | Freq: Two times a day (BID) | RESPIRATORY_TRACT | 6 refills | Status: DC

## 2024-03-20 NOTE — Patient Instructions (Signed)
 Nice to see you again  Use Symbicort  2 puffs in the morning, 2 puffs in the evening.  Rinse her mouth out with water after every use.  Use albuterol  15 to 20 minutes before exertion, exercise etc.  You can also use it as needed to see if it helps relieve the chest and neck discomfort.  Hopefully this will help with your symptoms, something like asthma can cause the symptoms.  The inhalers are used to treat this.  To further investigate why this could be happening I ordered a CT scan with contrast to evaluate for pulmonary embolus or blood clots in the lungs, I think this is less likely but we need to know  Lastly, I would pulmonary function test to see how well your lungs are functioning to see if we can arrive at a more conclusive diagnosis  Return to clinic in 2 months or sooner as needed with Dr. Annella, after pulmonary function test and CT scan

## 2024-03-20 NOTE — Progress Notes (Signed)
 @Patient  ID: Danielle [name], female    DOB: 06/23/1971, 53 y.o.   MRN: [id number]  Chief Complaint  Patient presents with   Consult    Sob and lungs hurt when walk     Referring provider: Sun, Vyvyan, MD  HPI:   53 y.o. woman whom are seen for evaluation of dyspnea exertion and chest, neck pain.  Cardiology note reviewed.  PCP note reviewed.  Symptoms present for about 1 year.  Dyspnea on exertion.  Going up inclines or steps worse.  Okay on flat surfaces it seems.  Associated with the shortness of breath is a pain or discomfort.  Upper chest and neck area.  Seems like it radiates to there.  When she sits and rest it gets better after a couple of minutes.  No time of day when things are better or worse.  No position that makes it is better or worse.  No seasonal environmental factors she can identify to make things better or worse.  She has no history of blood clots.  She was seen by cardiology.  TTE at rest without significant abnormalities.  Documentation indicates desire for coronary CT to evaluate for possible blockage ischemia etc., this was never performed.  I cannot see that was even scheduled.  Patient states she called cardiology office asking for this to be ordered or scheduled and was unable to make any progress.  She denies a history of asthma.  No significant seasonal allergy.  She has not used inhalers.  We discussed additional workup to rule out pulmonary embolism.  She denies any surgeries or prolonged immobilization.  Fortunately she is not tachycardic and her oxygen saturation is okay but this could be a cause.  Additionally discussed possible onset of asthma at this can mimic the symptoms.  Discussed the role and rationale for treatment this.  Discussed additional testing with PFTs.  Questionaires / Pulmonary Flowsheets:   ACT:      No data to display          MMRC:     No data to display          Epworth:      No data to display          Tests:    FENO:  No results found for: NITRICOXIDE  PFT:     No data to display          WALK:      No data to display          Imaging: Personally reviewed and as per EMR in discussion this note MM 3D DIAGNOSTIC MAMMOGRAM BILATERAL BREAST Result Date: 03/08/2024 CLINICAL DATA:  BI-RADS 3 follow-up of a RIGHT breast mass, initiated July 2023. Patient is status post ultrasound-guided biopsy of an adjacent mass which demonstrated a benign fibroadenoma. EXAM: DIGITAL DIAGNOSTIC BILATERAL MAMMOGRAM WITH TOMOSYNTHESIS AND CAD; ULTRASOUND RIGHT BREAST LIMITED TECHNIQUE: Bilateral digital diagnostic mammography and breast tomosynthesis was performed. The images were evaluated with computer-aided detection. ; Targeted ultrasound examination of the right breast was performed COMPARISON:  Previous exam(s). ACR Breast Density Category c: The breasts are heterogeneously dense, which may obscure small masses. FINDINGS: Diagnostic images of the RIGHT breast demonstrate no new suspicious findings in the RIGHT upper inner breast. Stable mammographic appearance of a previously biopsied benign RIGHT breast mass consistent with a benign fibroadenoma (COIL clip). No suspicious mass, distortion, or microcalcifications are identified to suggest presence of malignancy. Targeted ultrasound was performed of the RIGHT breast.  At 1 o'clock 7 cm from the nipple there is revisualization of an oval circumscribed mass. It measures 7 x 4 x 4 mm, previously 7 x 6 x 4 mm. IMPRESSION: 1. Stable RIGHT breast mass for greater than 2 years, consistent with a benign etiology. 2. No mammographic evidence of malignancy bilaterally. RECOMMENDATION: Screening mammogram in one year.(Code:SM-B-01Y) I have discussed the findings and recommendations with the patient. If applicable, a reminder letter will be sent to the patient regarding the next appointment. BI-RADS CATEGORY  2: Benign. Electronically Signed   By: Corean Salter M.D.   On:  03/08/2024 09:08   US  LIMITED ULTRASOUND INCLUDING AXILLA RIGHT BREAST Result Date: 03/08/2024 CLINICAL DATA:  BI-RADS 3 follow-up of a RIGHT breast mass, initiated July 2023. Patient is status post ultrasound-guided biopsy of an adjacent mass which demonstrated a benign fibroadenoma. EXAM: DIGITAL DIAGNOSTIC BILATERAL MAMMOGRAM WITH TOMOSYNTHESIS AND CAD; ULTRASOUND RIGHT BREAST LIMITED TECHNIQUE: Bilateral digital diagnostic mammography and breast tomosynthesis was performed. The images were evaluated with computer-aided detection. ; Targeted ultrasound examination of the right breast was performed COMPARISON:  Previous exam(s). ACR Breast Density Category c: The breasts are heterogeneously dense, which may obscure small masses. FINDINGS: Diagnostic images of the RIGHT breast demonstrate no new suspicious findings in the RIGHT upper inner breast. Stable mammographic appearance of a previously biopsied benign RIGHT breast mass consistent with a benign fibroadenoma (COIL clip). No suspicious mass, distortion, or microcalcifications are identified to suggest presence of malignancy. Targeted ultrasound was performed of the RIGHT breast. At 1 o'clock 7 cm from the nipple there is revisualization of an oval circumscribed mass. It measures 7 x 4 x 4 mm, previously 7 x 6 x 4 mm. IMPRESSION: 1. Stable RIGHT breast mass for greater than 2 years, consistent with a benign etiology. 2. No mammographic evidence of malignancy bilaterally. RECOMMENDATION: Screening mammogram in one year.(Code:SM-B-01Y) I have discussed the findings and recommendations with the patient. If applicable, a reminder letter will be sent to the patient regarding the next appointment. BI-RADS CATEGORY  2: Benign. Electronically Signed   By: Corean Salter M.D.   On: 03/08/2024 09:08    Lab Results: Personally reviewed CBC    Component Value Date/Time   WBC 6.8 11/02/2022 0854   RBC 4.42 11/02/2022 0854   HGB 11.6 (L) 11/02/2022 0854    HCT 37.1 11/02/2022 0854   PLT 422 (H) 11/02/2022 0854   MCV 83.9 11/02/2022 0854   MCH 26.2 11/02/2022 0854   MCHC 31.3 11/02/2022 0854   RDW 15.1 11/02/2022 0854   LYMPHSABS 1,800 04/17/2018 1614   MONOABS 0.6 04/30/2014 1654   EOSABS 100 04/17/2018 1614   BASOSABS 30 04/17/2018 1614    BMET    Component Value Date/Time   NA 139 04/06/2019 0821   K 3.4 (L) 04/06/2019 0821   CL 107 04/06/2019 0821   CO2 24 04/06/2019 0821   GLUCOSE 87 04/06/2019 0821   BUN <5 (L) 04/06/2019 0821   CREATININE 0.71 04/06/2019 0821   CREATININE 0.79 04/17/2018 1614   CALCIUM 8.4 (L) 04/06/2019 0821   GFRNONAA >60 04/06/2019 0821   GFRAA >60 04/06/2019 0821    BNP No results found for: BNP  ProBNP No results found for: PROBNP  Specialty Problems   None   Allergies  Allergen Reactions   Septra [Bactrim] Nausea And Vomiting   Flagyl  [Metronidazole ] Nausea Only    Immunization History  Administered Date(s) Administered   Influenza,inj,Quad PF,6+ Mos 04/30/2014, 05/18/2016   Moderna  Sars-Covid-2 Vaccination 09/12/2019, 10/15/2019    Past Medical History:  Diagnosis Date   Bacterial vaginosis    RECURRENT   Difficult intravenous access    Difficult IV stick.   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   budesonide -formoterol  (SYMBICORT ) 160-4.5 MCG/ACT inhaler Inhale 2 puffs into the lungs 2 (two) times daily.   carvedilol  (COREG ) 3.125 MG  tablet Take 1 tablet (3.125 mg total) by mouth 2 (two) times daily with a meal.   ibuprofen  (ADVIL ) 800 MG tablet Take 1 tablet (800 mg total) by mouth every 8 (eight) hours as needed for mild pain, moderate pain or cramping.   lisinopril (ZESTRIL) 5 MG tablet 1 tab Orally Once a day; Duration: 30 days   [DISCONTINUED] metoprolol  tartrate (LOPRESSOR ) 100 MG tablet Take 2 hours prior to CT scan   No facility-administered encounter medications on file as of 03/20/2024.     Review of Systems  Review of Systems  No chest pain with exertion.  No orthopnea or PND.  Comprehensive review of system otherwise negative. Physical Exam  BP 137/81   Pulse 82   Temp 98.5 F (36.9 C) (Oral)   Ht 5' 5 (1.651 m)   Wt 175 lb 3.2 oz (79.5 kg)   LMP 10/27/2022 (Exact Date)   SpO2 99%   BMI 29.15 kg/m   Wt Readings from Last 5 Encounters:  03/20/24 175 lb 3.2 oz (79.5 kg)  11/20/23 171 lb (77.6 kg)  11/02/23 173 lb 4 oz (78.6 kg)  11/09/22 164 lb 12.8 oz (74.8 kg)  11/02/22 165 lb (74.8 kg)    BMI Readings from Last 5 Encounters:  03/20/24 29.15 kg/m  11/20/23 28.46 kg/m  11/02/23 28.83 kg/m  11/09/22 27.42 kg/m  11/02/22 27.46 kg/m     Physical Exam General: Sitting in chair, no acute distress Eyes: EOMI, icterus Neck: Supple, no JVP Pulmonary: Clear, poor excursion, normal work of breathing Cardiovascular: Warm, no edema Abdomen: Nondistended MSK: No synovitis, no joint effusion Neuro: Normal gait, no weakness Psych: Normal mood, full affect   Assessment & Plan:   Dyspnea on exertion: Unclear etiology.  Only occurs with exertion and relieved quickly with rest.  Associated with chest and pain that radiates to the neck or throat based on her description.  Cardiac etiologies considered, echocardiogram looks good but this is at rest.  CT cardiac/coronary was ordered but never performed.  Will message cardiologist to get this scheduled as I think inducible ischemia needs to be  evaluated and ruled out.  Based on description of radiation to the neck and quick resolution with rest.  Certainly asthma is possible.  PFTs for further evaluation.  CTA PE protocol also ordered given chest discomfort with exertion.  Asthma: Presumed diagnosis with chest tightness shortness of breath.  Not totally convinced.  Trial Symbicort  2 puffs twice a day every day, albuterol  as needed particular prior to exercise  etc. see if this helps with symptoms.   Return in about 2 months (around 05/20/2024) for f/u Dr. Annella.   Danielle JONELLE Annella, MD 03/20/2024   This appointment required 62 minutes of patient care (this includes precharting, chart review, review of results, face-to-face care, etc.).

## 2024-03-25 ENCOUNTER — Ambulatory Visit
Admission: RE | Admit: 2024-03-25 | Discharge: 2024-03-25 | Disposition: A | Discharge status: Home / Self Care | Source: Ambulatory Visit | Attending: Pulmonary Disease | Admitting: Pulmonary Disease

## 2024-03-25 DIAGNOSIS — R079 Chest pain, unspecified: Secondary | ICD-10-CM

## 2024-03-25 DIAGNOSIS — R911 Solitary pulmonary nodule: Secondary | ICD-10-CM | POA: Diagnosis not present

## 2024-03-25 MED ORDER — IOPAMIDOL (ISOVUE-370) INJECTION 76%
75.0000 mL | Freq: Once | INTRAVENOUS | Status: AC | PRN
  Administered 2024-03-25: 75 mL via INTRAVENOUS

## 2024-03-26 ENCOUNTER — Other Ambulatory Visit: Payer: Self-pay

## 2024-03-26 DIAGNOSIS — R072 Precordial pain: Secondary | ICD-10-CM

## 2024-03-26 NOTE — Progress Notes (Signed)
 Cardiac CT ordered by MD at 11/20/23 OV.

## 2024-03-31 ENCOUNTER — Ambulatory Visit: Payer: Self-pay | Admitting: Pulmonary Disease

## 2024-03-31 DIAGNOSIS — E041 Nontoxic single thyroid nodule: Secondary | ICD-10-CM

## 2024-03-31 NOTE — Telephone Encounter (Signed)
 Thyroid  ultrasound ordered given thyroid  nodule seen on CT scan.

## 2024-04-01 ENCOUNTER — Encounter (HOSPITAL_COMMUNITY): Payer: Self-pay

## 2024-04-01 ENCOUNTER — Other Ambulatory Visit: Payer: Self-pay

## 2024-04-01 ENCOUNTER — Telehealth: Payer: Self-pay | Admitting: Internal Medicine

## 2024-04-01 MED ORDER — METOPROLOL TARTRATE 100 MG PO TABS: ORAL_TABLET | ORAL | 0 refills | Status: DC

## 2024-04-01 NOTE — Telephone Encounter (Signed)
 Pt c/o medication issue:  1. Name of Medication:   Metoprolol , 200 mg  2. How are you currently taking this medication (dosage and times per day)?   3. Are you having a reaction (difficulty breathing--STAT)?   4. What is your medication issue?   Patient stated she received 1 tablet of this medication to be taken prior to her test on 10/1 but she inadvertently took the medication today.  Patient wants call back regarding next steps.

## 2024-04-01 NOTE — Telephone Encounter (Signed)
 Spoke with Pt. Pt was thinking it was the first and got up and ready for her appt, including taking her medication. Patient is feeling fine. Sent in new tablet to pharmacy which she will take before her procedure. Advised I would send over to Dr Santo just to let him know.

## 2024-04-02 NOTE — Progress Notes (Signed)
 Patient has been scheduled for tomorrow

## 2024-04-03 ENCOUNTER — Ambulatory Visit (HOSPITAL_COMMUNITY)
Admission: RE | Admit: 2024-04-03 | Discharge: 2024-04-03 | Disposition: A | Discharge status: Home / Self Care | Source: Ambulatory Visit | Attending: Internal Medicine | Admitting: Internal Medicine

## 2024-04-03 ENCOUNTER — Ambulatory Visit: Payer: Self-pay | Admitting: Internal Medicine

## 2024-04-03 ENCOUNTER — Inpatient Hospital Stay: Admission: RE | Admit: 2024-04-03 | Discharge: 2024-04-03 | Attending: Pulmonary Disease | Admitting: Pulmonary Disease

## 2024-04-03 DIAGNOSIS — R072 Precordial pain: Secondary | ICD-10-CM | POA: Diagnosis not present

## 2024-04-03 DIAGNOSIS — E041 Nontoxic single thyroid nodule: Secondary | ICD-10-CM

## 2024-04-03 MED ORDER — NITROGLYCERIN 0.4 MG SL SUBL: 0.8000 mg | SUBLINGUAL_TABLET | Freq: Once | SUBLINGUAL | Status: DC

## 2024-04-03 MED ORDER — IOHEXOL 350 MG/ML SOLN
100.0000 mL | Freq: Once | INTRAVENOUS | Status: AC | PRN
Start: 2024-04-03 — End: 2024-04-03
  Administered 2024-04-03: 100 mL via INTRAVENOUS

## 2024-04-08 NOTE — Telephone Encounter (Signed)
 Please advise,US  of thyroid  is in pt chart

## 2024-04-08 NOTE — Telephone Encounter (Signed)
 Called and discussed results of thyroid  ultrasound.  Recommend biopsy of nodule.  Is possible this is causing the discomfort or pain that she has been describing with exertion or movement.  Urgent thyroid  biopsy ordered today.

## 2024-04-08 NOTE — Telephone Encounter (Signed)
 Copied from CRM [id number]. Topic: Clinical - Medical Advice >> Apr 08, 2024 12:08 PM Corean SAUNDERS wrote: Reason for CRM: Patient is requesting a call back from Dr. Annella or nurse as on 10/1 patient was reccommended to have a fine needle aspiration and she would like to discuss next steps.  Dr. Annella spoke with the patient today.

## 2024-04-11 ENCOUNTER — Other Ambulatory Visit

## 2024-04-16 ENCOUNTER — Ambulatory Visit
Admission: RE | Admit: 2024-04-16 | Discharge: 2024-04-16 | Disposition: A | Source: Ambulatory Visit | Attending: Pulmonary Disease | Admitting: Pulmonary Disease

## 2024-04-16 ENCOUNTER — Encounter: Admitting: Physical Therapy

## 2024-04-16 ENCOUNTER — Other Ambulatory Visit (HOSPITAL_COMMUNITY)
Admission: RE | Admit: 2024-04-16 | Discharge: 2024-04-16 | Disposition: A | Discharge status: Home / Self Care | Source: Ambulatory Visit | Attending: Student | Admitting: Student

## 2024-04-16 DIAGNOSIS — E041 Nontoxic single thyroid nodule: Secondary | ICD-10-CM | POA: Insufficient documentation

## 2024-04-18 LAB — CYTOLOGY - NON PAP

## 2024-04-19 ENCOUNTER — Ambulatory Visit: Payer: Self-pay | Admitting: Pulmonary Disease

## 2024-04-19 DIAGNOSIS — C73 Malignant neoplasm of thyroid gland: Secondary | ICD-10-CM

## 2024-04-19 NOTE — Telephone Encounter (Signed)
 Called patient.  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  Expressed my sympathy for the results, obviously we were hoping for a better result.  Discussed the importance of surgical and oncologic referrals.  Referred to ENT for consideration of resection and referred to oncology for further evaluation and workup.  Stat referral was placed today.

## 2024-04-22 ENCOUNTER — Ambulatory Visit (INDEPENDENT_AMBULATORY_CARE_PROVIDER_SITE_OTHER): Admitting: Otolaryngology

## 2024-04-22 ENCOUNTER — Encounter (INDEPENDENT_AMBULATORY_CARE_PROVIDER_SITE_OTHER): Payer: Self-pay | Admitting: Otolaryngology

## 2024-04-22 VITALS — BP 141/87 | HR 84 | Ht 65.0 in | Wt 170.0 lb

## 2024-04-22 DIAGNOSIS — E041 Nontoxic single thyroid nodule: Secondary | ICD-10-CM | POA: Diagnosis not present

## 2024-04-22 DIAGNOSIS — J383 Other diseases of vocal cords: Secondary | ICD-10-CM | POA: Diagnosis not present

## 2024-04-22 NOTE — Progress Notes (Signed)
 Referral sent - needs ASAP scheduling.

## 2024-04-22 NOTE — Progress Notes (Signed)
 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 Annella. I reviewed the referral with Dr. Chinita Patten, medical oncologist and he informed me that for this patient's type of cancer she should see ENT and Endocrinology. She has been referred to ENT and is seeing Dr. Tobie later today. I have called Dr. Leatha office and informed them of the need for an endocrinology appointment and the closing of the oncology appointment. I have also called Ms. Sellen and informed her of the above information.   Delon Jefferson RN, BSN, OCN Head & Neck Oncology Nurse Navigator [facility] Phone # [phone]  Fax # [phone]

## 2024-04-22 NOTE — Progress Notes (Signed)
 Dear Dr. Annella, Here is my assessment for our mutual patient, Danielle [name]. Thank you for allowing me the opportunity to care for your patient. Please do not hesitate to contact me should you have any other questions. Sincerely, Dr. Eldora Blanch  Otolaryngology Clinic Note Referring provider: Dr. Annella HPI:  Initial visit (04/2024) Discussed the use of AI scribe software for clinical note transcription with the patient, who gave verbal consent to proceed.  History of Present Illness Danielle [name] is a 53 year old female who presents with thyroid  nodule suspicious for cancer  She initially experienced shortness of breath, leading to a CT scan that revealed a thyroid  nodule. Ultrasound and biopsy has been done, which is as below. Her primary concern is shortness of breath affecting daily activities. Other symptoms include some vague pressure in her neck but has not noticed thyroid  enlargement or goiter. There is no hoarseness, dysphagia, or neck pain or neck masses. She has no symptoms of hyperthyroidism or hypothyroidism, such as heat or cold intolerance, alopecia, diarrhea, or constipation.  She has no history of tobacco use, except for occasional cigars, and denies any prior radiation exposure to the head or neck. There is no family history of thyroid  cancer.  H&N Surgery: denies Personal or FHx of bleeding dz or anesthesia difficulty: no  GLP-1: no AP/AC: no  PMHx: HTN, HLD, Dyspnea (possible asthma)  Independent Review of Additional Tests or Records:  US  Thyroid  04/03/2024 reviewed and interpreted: noted 5x3x2.5 cm TIRADS 4 right thyroid  lobe nodule; Left lobe with small, subcentimeter nodules. Appears well demarcated FNA 04/16/2024: Bethesda IV; Afirma pending CBC 11/02/2022: WBC 6.8 CTA Chest 03/25/2024 interpreted: mild retrosternal extent, right > left thyromegaly PMH/Meds/All/SocHx/FamHx/ROS:   Past Medical History:  Diagnosis Date   Bacterial vaginosis     RECURRENT   Difficult intravenous access    Difficult IV stick.   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   HSV (herpes simplex virus) anogenital infection    Migraines    takes OTC   Pre-diabetes 2019   04/17/18 HgbA1c 6.2   Wears glasses    readers only     Past Surgical History:  Procedure Laterality Date   BREAST BIOPSY Right 01/21/2022   right breast core needle biopsy / pathology=fibroadenoma, neg for malignancy   CESAREAN SECTION  12/26/2000   CHOLECYSTECTOMY N/A 04/06/2019   Procedure: LAPAROSCOPIC CHOLECYSTECTOMY;  Surgeon: Vanderbilt Ned, MD;  Location: [facility] OR;  Service: General;  Laterality: N/A;   CYST EXCISION N/A 04/21/2017   Procedure: EXCISION OF SEBACEOUS CYST OF BILATERAL THIGHS AND LEFT LABIA;  Surgeon: Signe Mitzie LABOR, MD;  Location: [facility];  Service: General;  Laterality: N/A;   CYSTOSCOPY  11/09/2022   Procedure: CYSTOSCOPY;  Surgeon: Storm Setter, DO;  Location: [facility];  Service: Gynecology;;   INCISE AND DRAIN ABCESS  09/23/2009   LEFT LABIAL-SKENE    LAPAROTOMY  11/09/2022   Procedure: GELPORT ASSISTED MINI LAPAROTOMY;  Surgeon: Storm Setter, DO;  Location: [facility];  Service: Gynecology;;   ROBOTIC ASSISTED LAPAROSCOPIC HYSTERECTOMY AND SALPINGECTOMY Bilateral 11/09/2022   Procedure: XI ROBOTIC ASSISTED LAPAROSCOPIC HYSTERECTOMY AND SALPINGECTOMY;  Surgeon: Storm Setter, DO;  Location: [facility] ;  Service: Gynecology;  Laterality: Bilateral;   TUBAL LIGATION  07/04/2007    Family History  Problem Relation Age of Onset   Diabetes Mother    Hypertension Mother    Diabetes Father      Social Connections: Not on file  Current Outpatient Medications:    albuterol  (VENTOLIN  HFA) 108 (90 Base) MCG/ACT inhaler, Inhale 2 puffs into the lungs every 6 (six) hours as needed for wheezing or shortness of breath., Disp: 8 g, Rfl: 6   budesonide -formoterol  (SYMBICORT ) 160-4.5  MCG/ACT inhaler, Inhale 2 puffs into the lungs 2 (two) times daily., Disp: 1 each, Rfl: 6   carvedilol  (COREG ) 3.125 MG tablet, Take 1 tablet (3.125 mg total) by mouth 2 (two) times daily with a meal., Disp: 180 tablet, Rfl: 3   ibuprofen  (ADVIL ) 800 MG tablet, Take 1 tablet (800 mg total) by mouth every 8 (eight) hours as needed for mild pain, moderate pain or cramping., Disp: 30 tablet, Rfl: 1   lisinopril (ZESTRIL) 5 MG tablet, 1 tab Orally Once a day; Duration: 30 days, Disp: , Rfl:    metoprolol  tartrate (LOPRESSOR ) 100 MG tablet, Take 2 hours prior to CT scan, Disp: 1 tablet, Rfl: 0   Physical Exam:   BP (!) 141/87 (BP Location: Left Arm, Patient Position: Sitting, Cuff Size: Large)   Pulse 84   Ht 5' 5 (1.651 m)   Wt 170 lb (77.1 kg)   LMP 10/27/2022 (Exact Date)   SpO2 95%   BMI 28.29 kg/m   Salient findings:  CN II-XII intact  Bilateral EAC clear and TM intact with well pneumatized middle ear spaces Anterior rhinoscopy: Septum intact; bilateral inferior turbinates without significant hypertrophy No lesions of oral cavity/oropharynx No obviously palpable neck masses/lymphadenopathy - right > left thyromegaly, overall soft, fairly good neck extension No respiratory distress or stridor; voice quality class 1.5; TFL was indicated to better evaluate the proximal airway, given the patient's history and exam findings, and is detailed below.   Seprately Identifiable Procedures:  Prior to initiating any procedures, risks/benefits/alternatives were explained to the patient and verbal consent obtained. Procedure Note Pre-procedure diagnosis: Thyroid  nodule, assess vocal fold function Post-procedure diagnosis: Same Procedure: Transnasal Fiberoptic Laryngoscopy, CPT 31575 - Mod 25 Indication: see above Complications: None apparent EBL: 0 mL  The procedure was undertaken to further evaluate the patient's complaint above, with mirror exam inadequate for appropriate examination due to  gag reflex and poor patient tolerance  Procedure:  Patient was identified as correct patient. Verbal consent was obtained. The nose was sprayed with oxymetazoline and 4% lidocaine . The The flexible laryngoscope was passed through the nose to view the nasal cavity, pharynx (oropharynx, hypopharynx) and larynx.  The larynx was examined at rest and during multiple phonatory tasks. Documentation was obtained and reviewed with patient. The scope was removed. The patient tolerated the procedure well.  Findings: The nasal cavity and nasopharynx did not reveal any masses or lesions, mucosa appeared to be without obvious lesions. The tongue base, pharyngeal walls, piriform sinuses, vallecula, epiglottis and postcricoid region are normal in appearance. The visualized portion of the subglottis and proximal trachea is widely patent. The vocal folds are mobile bilaterally. There are no lesions on the free edge of the vocal folds nor elsewhere in the larynx worrisome for malignancy.  No significant tracheal deviation  Electronically signed by: Eldora KATHEE Blanch, MD 05/04/2024 5:14 PM   Impression & Plans:  Nadeen Shipman is a 53 y.o. female with:  1. Thyroid  nodule    Bethesda 4 thyroid  nodule, Afirma pending. She is having some SOB with activity but I do not think this is related to her thyroid . No prior TFT. We discussed options including observation, hemi v/s total thyroidectomy and risks. She wishes to start with lobectomy, which  is reasonable given does not have large nodules on the other side. Regardless, will see what Afirma results say. She understands if malignancy, depending on characteristics, may need completion - Will get TFT - Schedule follow-up phone visit next week for genetic results and surgical planning. - Coordinate surgery scheduling post-November trip if surgery chosen.  Risks of thyroidectomy were discussed including pain, bleeding, infection, need for drain placement, injury to  recurrent laryngeal nerve including vocal fold paralysis and hoarseness, lack of improvement, need for further procedures, change in swallowing, hypocalcemia (not with lobectomy), injury to trachea or surrounding great vessels including pulmonary complications, and discovery of malignancy. Patient understands these risks, and despite these risks, wishes to proceed.  See below regarding exact medications prescribed this encounter including dosages and route: No orders of the defined types were placed in this encounter.     Thank you for allowing me the opportunity to care for your patient. Please do not hesitate to contact me should you have any other questions.  Sincerely, Eldora Blanch, MD Otolaryngologist (ENT), [facility] Phone: [phone] Fax: [phone]  05/04/2024, 5:14 PM   MDM:  Level 4 - 99204 Complexity/Problems addressed: mod Data complexity: mod - independent CT interpretation; review of lab, test/image - Morbidity: mod - decision for surgery  - Prescription Drug prescribed or managed: n

## 2024-04-22 NOTE — Patient Instructions (Signed)
 [facility] [address]  [phone] Open ? Closes 5?PM   -- Get your labs there for your thyroid 

## 2024-04-22 NOTE — Telephone Encounter (Signed)
 Copied from CRM #[id number]. Topic: Clinical - Medical Advice >> Apr 22, 2024 10:16 AM Rozanna MATSU wrote: Reason for CRM: Delon with the Oncology clinic stated Dr [facility] sent a referral for this pt but she is does not need Oncology she needs Endocrinology. Thanks  Sending to Dr. Annella

## 2024-04-24 NOTE — Progress Notes (Signed)
 Nothing further needed

## 2024-04-25 DIAGNOSIS — E041 Nontoxic single thyroid nodule: Secondary | ICD-10-CM | POA: Diagnosis not present

## 2024-04-26 LAB — T4, FREE: Free T4: 1.07 ng/dL (ref 0.82–1.77)

## 2024-04-26 LAB — TSH: TSH: 0.473 u[IU]/mL (ref 0.450–4.500)

## 2024-04-29 DIAGNOSIS — E041 Nontoxic single thyroid nodule: Secondary | ICD-10-CM | POA: Diagnosis not present

## 2024-04-29 NOTE — Progress Notes (Signed)
 Otolaryngology Clinic Note  HPI:    New Patient (Pt presents for enlarged thyroid  nodules )    Danielle [name] is a 53 y.o. female who presents as a new patient, who presents for a second opinion regarding a nodule on the right side of her thyroid .  She reports experiencing a burning sensation in her throat during physical activities such as walking or climbing stairs, which necessitates her to pause and recover. She describes the sensation as painful rather than a feeling of shortness of breath. A CT scan conducted by her cardiologist revealed a nodule, prompting a referral to Dr. Annella, a pulmonologist. The pulmonologist's findings were normal, but a thyroid  nodule nodule was identified during the CT scan.  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 She is scheduled for a thyroidectomy in the upcoming months. She was referred to an ENT specialist and an oncologist. The oncologist informed her that due to the type of cancer, she would not require treatment at their office but would possibly need radioactive iodine. She was then referred back to Dr. Delayne for an endocrinologist referral. The endocrinologist advised her to schedule an appointment.  Dr. Tobie performed a laryngoscopy and suggested that her symptoms might not be related to the thyroid  nodule. She is seeking a second opinion due to her primary concern about the cancer diagnosis. She has a pulmonary function test scheduled for next month and is considering whether to proceed with it. She has not yet consulted with an endocrinologist.  She reports no history of keloid formation from previous incisions. She is not currently taking any blood-thinning medications but is on medication for blood pressure. She is not currently under the care of a cardiologist.   PMH/Meds/All/SocHx/FamHx/ROS:   Medical History[1]  Surgical History[2]  No family history  of bleeding disorders, wound healing problems or difficulty with anesthesia.   Social History   Socioeconomic History  . Marital status: Married    Spouse name: Not on file  . Number of children: Not on file  . Years of education: Not on file  . Highest education level: Not on file  Occupational History  . Not on file  Tobacco Use  . Smoking status: Never  . Smokeless tobacco: Never  Substance and Sexual Activity  . Alcohol use: No  . Drug use: Not on file  . Sexual activity: Not on file  Other Topics Concern  . Not on file  Social History Narrative  . Not on file   Social Drivers of Health   Food Insecurity: No Food Insecurity (11/02/2023)   Received from [facility]   Food vital sign   . Within the past 12 months, you worried that your food would run out before you got money to buy more: Never true   . Within the past 12 months, the food you bought just didn't last and you didn't have money to get more: Never true  Transportation Needs: No Transportation Needs (11/02/2023)   Received from [facility] - Transportation   . Lack of Transportation (Medical): No   . Lack of Transportation (Non-Medical): No  Safety: Not on file  Living Situation: Not on file    Current Medications[3]  A complete ROS was performed with pertinent positives/negatives noted in the HPI. The remainder of the ROS are negative.    Physical Exam:    BP 136/82   Pulse 85   Temp  96.2 F (35.7 C) (Temporal)   Ht 1.651 m (5' 5)   Wt 78.2 kg (172 lb 6.4 oz)   BMI 28.69 kg/m    General: Well developed, well nourished. No acute distress. Voice normal, no voice breaks  Head/Face: Normocephalic, atraumatic. No scars or lesions. No sinus tenderness. Facial nerve intact and equal bilaterally.   Eyes: Pupils are equal, round and reactive to light. Conjunctiva and lids are normal. Normal extraocular mobility.   Ears:   Right: Pinna and external meatus normal   Left: Pinna and external meatus  normal  Nose: No gross deformity or lesions. No purulent discharge.   Mouth/Oropharynx: Lips normal, teeth and gums normal with good dentition, normal oral vestibule. Normal floor of mouth, tongue and oral mucosa, no mucosal lesions, ulcer or mass, normal tongue mobility. Uvula midline. Hard and soft palate normal with normal mobility. Symmetric tonsils, no erythema or exudate.  Larynx: See TFL if applicable  Nasopharynx: See TFL if applicable  Neck: Trachea midline. No masses. No crepitus.  Palpable nodule of the right thyroid  gland, left thyroid  normal to palpation. Salivary glands normal to palpation without swelling, erythema or mass.   Lymphatic: No lymphadenopathy in the neck.  Respiratory: No stridor or distress.  Extremities: No edema or cyanosis. Warm and well-perfused.  Neurologic: CN II-XII intact. Alert and oriented to self, place and time.  Normal reflexes and motor skills, balance and coordination. Moving all extremities without gross abnormality.  Psychiatric:  No unusual anxiety or evidence of depression. Appropriate affect.    Independent Review of Additional Tests or Records:  Documentation from recent visits with pulmonology, cardiology, otolaryngology reviewed.   Recent imaging to include cytopathology, thyroid  ultrasound and CT reviewed.  Clinical History: Nodule #1: 5.1 cm x 2.9 cm x 2.4 cm, solid, very  hypoechoic 3. Wider than tall, smooth, No echogenic foci, TI-RADS 4. Rt  lobe.  Specimen Submitted:  A. 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   Component 04/25/2024 <redacted file path> 04/25/2024 <redacted file path>      TSH 0.473 <redacted file path> --  Free T4 -- 1.07 <redacted file path>      EXAM:  THYROID  ULTRASOUND    TECHNIQUE:  Ultrasound examination of the thyroid  gland and adjacent soft  tissues was performed.   COMPARISON:  None Available.   FINDINGS:  Parenchymal Echotexture: Mildly heterogenous   Isthmus: 0.5 cm   Right lobe: 6.4 cm x 3.1 cm x 3.5 cm   Left lobe: 4.9 cm x 2.2 cm x 2.3 cm   _________________________________________________________   Estimated total number of nodules >/= 1 cm: 1   Number of spongiform nodules >/=  2 cm not described below (TR1): 0   Number of mixed cystic and solid nodules >/= 1.5 cm not described  below (TR2): 0   _________________________________________________________   Thyroid  right lobe   Nodule # 1   5.1 cm x 2.9 cm x 2.4 cm   Solid 2. Very hypoechoic 3. Wider than tall 0. Smooth 0. No  echogenic foci 0. TI-RADS 4 greater than 1.5 cm recommend FNA  sampling.   Thyroid  left lobe   Three nodules identified in the left lobe all measuring  subcentimeter and do not meet size criteria for imaging follow-up.   IMPRESSION:  TI-RADS 4 5.1 cm right lobe nodule. Recommend fine-needle  aspiration.   Procedures:  None  Impression & Plans:  Danielle [name] is a 53 y.o. 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  Afirma results not yet available to review.  Patient recently seen by otolaryngology for surgical consultation and reports no abnormality noted on fiberoptic laryngoscopy, documentation not yet available for review.  Patient does not have any symptoms of hoarseness or dysphagia, but does endorse shortness of breath with exertion, for which she is being seen by pulmonology.  She states that she has a pulmonary function test upcoming.  No family history of thyroid  cancer, no tobacco use history, no history of previous radiation exposure.  Based on history, physical exam, and results of testing, I recommend proceeding with  right total thyroidectomy vs right hemithyroidectomy. Afirma results are still pending, and once available will be discussed with patient to aid in decision making. I undertook a detailed discussion with the patient with respect to surgery, including the risks and benefits.  All treatment options were reviewed in depth.    The risks include but are not limited to: hypocalcemia, recurrent laryngeal nerve injury and its resultant effect on the voice and breathing and swallowing, intra-operative and post operative bleeding, infection, scarring, neck stiffness, pain, seroma, hematoma, numbness of the surgical site and surrounding skin, need for further surgery, need for adjunctive treatments, such as radioactive iodine, need for life-long daily thyroid  supplementation, need for tracheostomy, intubation or secondary airway surgery.   All questions were answered.  The patient voiced understanding of all the risks and benefits of the above-specified treatment plan and then willingly consented to the surgery.  Patient will discuss option of hemithyroidectomy versus total thyroidectomy with her husband, and notify my office directly as to how she would wish to proceed once Afirma results are available to review.  Meghan Jenkins Shope, DO Otolaryngology         [1] History reviewed. No pertinent past medical history. [2] Past Surgical History: Procedure Laterality Date  . LASIK Bilateral 2016   Procedure: LASIK  [3]  Current Outpatient Medications:  .  carvediloL  (COREG ) 3.125 mg tablet, Take 3.125 mg by mouth. with a meal, Disp: , Rfl:  .  lisinopriL (PRINIVIL) 5 mg tablet, duration: 30 days, Disp: , Rfl:

## 2024-04-30 ENCOUNTER — Ambulatory Visit (INDEPENDENT_AMBULATORY_CARE_PROVIDER_SITE_OTHER): Admitting: Otolaryngology

## 2024-04-30 ENCOUNTER — Encounter (HOSPITAL_COMMUNITY): Payer: Self-pay

## 2024-04-30 DIAGNOSIS — E041 Nontoxic single thyroid nodule: Secondary | ICD-10-CM | POA: Diagnosis not present

## 2024-04-30 DIAGNOSIS — R0602 Shortness of breath: Secondary | ICD-10-CM | POA: Diagnosis not present

## 2024-05-01 ENCOUNTER — Ambulatory Visit: Admitting: Endocrinology

## 2024-05-07 DIAGNOSIS — N898 Other specified noninflammatory disorders of vagina: Secondary | ICD-10-CM | POA: Diagnosis not present

## 2024-05-12 ENCOUNTER — Encounter (INDEPENDENT_AMBULATORY_CARE_PROVIDER_SITE_OTHER): Payer: Self-pay | Admitting: Otolaryngology

## 2024-05-12 NOTE — Progress Notes (Signed)
 Dear Dr. Austin, Here is my assessment for our mutual patient, Danielle [name]. Thank you for allowing me the opportunity to care for your patient. Please do not hesitate to contact me should you have any other questions. Sincerely, Dr. Eldora Blanch  Otolaryngology Clinic Note Referring provider: Dr. Austin HPI:  Initial visit (04/2024) Discussed the use of AI scribe software for clinical note transcription with the patient, who gave verbal consent to proceed.  History of Present Illness Danielle [name] is a 53 year old female who presents with thyroid  nodule suspicious for cancer  She initially experienced shortness of breath, leading to a CT scan that revealed a thyroid  nodule. Ultrasound and biopsy has been done, which is as below. Her primary concern is shortness of breath affecting daily activities. Other symptoms include some vague pressure in her neck but has not noticed thyroid  enlargement or goiter. There is no hoarseness, dysphagia, or neck pain or neck masses. She has no symptoms of hyperthyroidism or hypothyroidism, such as heat or cold intolerance, alopecia, diarrhea, or constipation.  She has no history of tobacco use, except for occasional cigars, and denies any prior radiation exposure to the head or neck. There is no family history of thyroid  cancer.  --------------------------------------------------------- 04/30/2024 The patient gave consent to have this visit done by telemedicine / virtual visit, two identifiers were used to identify patient. This is also consent for access the chart and treat the patient via this visit. The patient is located in [place] .  I, the provider, am at the office.  We spent 20 minutes in preparation and together for the visit.  Joined by telephone  Discussed with patient regarding her afirma results, which shows the nodule is suspicious for carcinoma (ROMalignancy ~75%) assoc with FA/NIFTP/FVPTC/FTC; VRAF/V600E neg. We discussed the  implications of this and next steps. She continues to wish to proceed with surgery, which is reasonable.   H&N Surgery: denies Personal or FHx of bleeding dz or anesthesia difficulty: no  GLP-1: no AP/AC: no  PMHx: HTN, HLD, Dyspnea (possible asthma)  Independent Review of Additional Tests or Records:  US  Thyroid  04/03/2024 reviewed and interpreted: noted 5x3x2.5 cm TIRADS 4 right thyroid  lobe nodule; Left lobe with small, subcentimeter nodules. Appears well demarcated FNA 04/16/2024: Bethesda IV; Afirma pending CBC 11/02/2022: WBC 6.8 CTA Chest 03/25/2024 interpreted: mild retrosternal extent, right > left thyromegaly Afirma results in media tab reviewed and discussed TFTs 04/25/2024: wnl PMH/Meds/All/SocHx/FamHx/ROS:   Past Medical History:  Diagnosis Date   Bacterial vaginosis    RECURRENT   Difficult intravenous access    Difficult IV stick.   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   HSV (herpes simplex virus) anogenital infection    Migraines    takes OTC   Pre-diabetes 2019   04/17/18 HgbA1c 6.2   Wears glasses    readers only     Past Surgical History:  Procedure Laterality Date   BREAST BIOPSY Right 01/21/2022   right breast core needle biopsy / pathology=fibroadenoma, neg for malignancy   CESAREAN SECTION  12/26/2000   CHOLECYSTECTOMY N/A 04/06/2019   Procedure: LAPAROSCOPIC CHOLECYSTECTOMY;  Surgeon: Vanderbilt Ned, MD;  Location: [facility] OR;  Service: General;  Laterality: N/A;   CYST EXCISION N/A 04/21/2017   Procedure: EXCISION OF SEBACEOUS CYST OF BILATERAL THIGHS AND LEFT LABIA;  Surgeon: Signe Mitzie LABOR, MD;  Location: [facility];  Service: General;  Laterality: N/A;   CYSTOSCOPY  11/09/2022   Procedure: CYSTOSCOPY;  Surgeon: Storm Setter, DO;  Location:  [facility];  Service: Gynecology;;   INCISE AND DRAIN ABCESS  09/23/2009   LEFT LABIAL-SKENE    LAPAROTOMY  11/09/2022   Procedure: GELPORT ASSISTED MINI LAPAROTOMY;   Surgeon: Storm Setter, DO;  Location: [facility] [place];  Service: Gynecology;;   ROBOTIC ASSISTED LAPAROSCOPIC HYSTERECTOMY AND SALPINGECTOMY Bilateral 11/09/2022   Procedure: XI ROBOTIC ASSISTED LAPAROSCOPIC HYSTERECTOMY AND SALPINGECTOMY;  Surgeon: Storm Setter, DO;  Location: [facility] [place];  Service: Gynecology;  Laterality: Bilateral;   TUBAL LIGATION  07/04/2007    Family History  Problem Relation Age of Onset   Diabetes Mother    Hypertension Mother    Diabetes Father      Social Connections: Not on file      Current Outpatient Medications:    albuterol  (VENTOLIN  HFA) 108 (90 Base) MCG/ACT inhaler, Inhale 2 puffs into the lungs every 6 (six) hours as needed for wheezing or shortness of breath., Disp: 8 g, Rfl: 6   budesonide -formoterol  (SYMBICORT ) 160-4.5 MCG/ACT inhaler, Inhale 2 puffs into the lungs 2 (two) times daily., Disp: 1 each, Rfl: 6   carvedilol  (COREG ) 3.125 MG tablet, Take 1 tablet (3.125 mg total) by mouth 2 (two) times daily with a meal., Disp: 180 tablet, Rfl: 3   ibuprofen  (ADVIL ) 800 MG tablet, Take 1 tablet (800 mg total) by mouth every 8 (eight) hours as needed for mild pain, moderate pain or cramping., Disp: 30 tablet, Rfl: 1   lisinopril (ZESTRIL) 5 MG tablet, 1 tab Orally Once a day; Duration: 30 days, Disp: , Rfl:    metoprolol  tartrate (LOPRESSOR ) 100 MG tablet, Take 2 hours prior to CT scan, Disp: 1 tablet, Rfl: 0   Physical Exam:   LMP 10/27/2022 (Exact Date)   Salient findings:  Strong voice, otherwise unable to assess given telephone visit   Seprately Identifiable Procedures:  Prior to initiating any procedures, risks/benefits/alternatives were explained to the patient and verbal consent obtained. Procedure Note (Prior) Pre-procedure diagnosis: Thyroid  nodule, assess vocal fold function Post-procedure diagnosis: Same Procedure: Transnasal Fiberoptic Laryngoscopy, CPT 31575 - Mod 25 Indication: see  above Complications: None apparent EBL: 0 mL  The procedure was undertaken to further evaluate the patient's complaint above, with mirror exam inadequate for appropriate examination due to gag reflex and poor patient tolerance  Procedure:  Patient was identified as correct patient. Verbal consent was obtained. The nose was sprayed with oxymetazoline and 4% lidocaine . The The flexible laryngoscope was passed through the nose to view the nasal cavity, pharynx (oropharynx, hypopharynx) and larynx.  The larynx was examined at rest and during multiple phonatory tasks. Documentation was obtained and reviewed with patient. The scope was removed. The patient tolerated the procedure well.  Findings: The nasal cavity and nasopharynx did not reveal any masses or lesions, mucosa appeared to be without obvious lesions. The tongue base, pharyngeal walls, piriform sinuses, vallecula, epiglottis and postcricoid region are normal in appearance. The visualized portion of the subglottis and proximal trachea is widely patent. The vocal folds are mobile bilaterally. There are no lesions on the free edge of the vocal folds nor elsewhere in the larynx worrisome for malignancy.  No significant tracheal deviation  Electronically signed by: Eldora KATHEE Blanch, MD 05/12/2024 10:46 AM   Impression & Plans:  Danielle [name] is a 53 y.o. female with:  1. Thyroid  nodule    Bethesda 4 thyroid  nodule, Afirma with risk of malignancy 75%  She is having some SOB with activity but I do not think this  is related to her thyroid . No prior TFT. We discussed options again including hemi v/s total thyroidectomy and risks. Makes most sense to start with lobectomy, which is reasonable given does not have large nodules on the other side.  She understands if malignancy, depending on characteristics, may need completion - TFTs wnl  Risks of thyroidectomy were discussed including pain, bleeding, infection, need for drain placement, injury to  recurrent laryngeal nerve including vocal fold paralysis and hoarseness, lack of improvement, need for further procedures, change in swallowing, hypocalcemia (not with lobectomy), injury to trachea or surrounding great vessels including pulmonary complications, and discovery of malignancy. Patient understands these risks, and despite these risks, wishes to proceed.  Will schedule for right thyroid  lobectomy with nerve monitoring; will plan overnight stya;   See below regarding exact medications prescribed this encounter including dosages and route: No orders of the defined types were placed in this encounter.     Thank you for allowing me the opportunity to care for your patient. Please do not hesitate to contact me should you have any other questions.  Sincerely, Eldora Blanch, MD Otolaryngologist (ENT), [facility] Phone: [phone] Fax: [phone]  05/12/2024, 10:46 AM

## 2024-05-20 NOTE — Telephone Encounter (Signed)
 2nd attempt/spoke with patient to see about scheduling her surgery. She said she just got back in town from vacation and that she was in a meeting and she would call me back.

## 2024-05-21 ENCOUNTER — Encounter: Payer: Self-pay | Admitting: Pulmonary Disease

## 2024-05-21 ENCOUNTER — Ambulatory Visit (INDEPENDENT_AMBULATORY_CARE_PROVIDER_SITE_OTHER): Admitting: Pulmonary Disease

## 2024-05-21 ENCOUNTER — Ambulatory Visit (INDEPENDENT_AMBULATORY_CARE_PROVIDER_SITE_OTHER)

## 2024-05-21 VITALS — BP 152/98 | HR 79

## 2024-05-21 DIAGNOSIS — R0609 Other forms of dyspnea: Secondary | ICD-10-CM

## 2024-05-21 DIAGNOSIS — R070 Pain in throat: Secondary | ICD-10-CM

## 2024-05-21 NOTE — Progress Notes (Signed)
 Patient could not perform test today. Attempted FVC more than 10 times, but patient stated she could not do it.

## 2024-05-21 NOTE — Progress Notes (Signed)
 @Patient  ID: Danielle [name], female    DOB: [birth year], 53 y.o.   MRN: [id number]  Chief Complaint  Patient presents with   Medical Management of Chronic Issues    Pt states post PFT     Referring provider: Diamantina Edinger, Donnice JONELLE, MD  HPI:   53 y.o. woman whom are seen for evaluation of dyspnea exertion and chest, neck pain.  Multiple ENT notes in the interim reviewed.  Continues to complain of throat discomfort.  No real dyspnea.  Trialed Symbicort  for the last couple months.  No improvement.  Workup included CTA for discomfort demonstrated no PE.  Chest imaging clear.  Coronary scan clear.  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  She has been referred to ENT, has received 2 opinions.  I have encouraged her to move forward with surgery.  She feels total thyroidectomy would be in her best interest.  PFTs were ordered today, she was unable to perform maneuvers, no data collected.  HPI initial visit: Symptoms present for about 1 year.  Dyspnea on exertion.  Going up inclines or steps worse.  Okay on flat surfaces it seems.  Associated with the shortness of breath is a pain or discomfort.  Upper chest and neck area.  Seems like it radiates to there.  When she sits and rest it gets better after a couple of minutes.  No time of day when things are better or worse.  No position that makes it is better or worse.  No seasonal environmental factors she can identify to make things better or worse.  She has no history of blood clots.  She was seen by cardiology.  TTE at rest without significant abnormalities.  Documentation indicates desire for coronary CT to evaluate for possible blockage ischemia etc., this was never performed.  I cannot see that was even scheduled.  Patient states she called cardiology office asking for this to be ordered or scheduled and was unable to make any progress.  She denies a history of asthma.   No significant seasonal allergy.  She has not used inhalers.  We discussed additional workup to rule out pulmonary embolism.  She denies any surgeries or prolonged immobilization.  Fortunately she is not tachycardic and her oxygen saturation is okay but this could be a cause.  Additionally discussed possible onset of asthma at this can mimic the symptoms.  Discussed the role and rationale for treatment this.  Discussed additional testing with PFTs.  Questionaires / Pulmonary Flowsheets:   ACT:      No data to display          MMRC:     No data to display          Epworth:      No data to display          Tests:   FENO:  No results found for: NITRICOXIDE  PFT:     No data to display          WALK:      No data to display          Imaging: Personally reviewed and as per EMR in discussion this note No results found.   Lab Results: Personally reviewed CBC    Component Value Date/Time   WBC 6.8 11/02/2022 0854   RBC 4.42 11/02/2022 0854   HGB 11.6 (L) 11/02/2022 0854   HCT 37.1 11/02/2022 0854   PLT 422 (H)  11/02/2022 0854   MCV 83.9 11/02/2022 0854   MCH 26.2 11/02/2022 0854   MCHC 31.3 11/02/2022 0854   RDW 15.1 11/02/2022 0854   LYMPHSABS 1,800 04/17/2018 1614   MONOABS 0.6 04/30/2014 1654   EOSABS 100 04/17/2018 1614   BASOSABS 30 04/17/2018 1614    BMET    Component Value Date/Time   NA 139 04/06/2019 0821   K 3.4 (L) 04/06/2019 0821   CL 107 04/06/2019 0821   CO2 24 04/06/2019 0821   GLUCOSE 87 04/06/2019 0821   BUN <5 (L) 04/06/2019 0821   CREATININE 0.71 04/06/2019 0821   CREATININE 0.79 04/17/2018 1614   CALCIUM 8.4 (L) 04/06/2019 0821   GFRNONAA >60 04/06/2019 0821   GFRAA >60 04/06/2019 0821    BNP No results found for: BNP  ProBNP No results found for: PROBNP  Specialty Problems   None   Allergies  Allergen Reactions   Septra [Bactrim] Nausea And Vomiting   Sulfur     Other Reaction(s): Unknown    Flagyl  [Metronidazole ] Nausea Only    Immunization History  Administered Date(s) Administered   Influenza,inj,Quad PF,6+ Mos 04/30/2014, 05/18/2016   Moderna Sars-Covid-2 Vaccination 09/12/2019, 10/15/2019    Past Medical History:  Diagnosis Date   Bacterial vaginosis    RECURRENT   Difficult intravenous access    Difficult IV stick.   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   carvedilol  (COREG ) 3.125 MG tablet Take 1 tablet (3.125 mg total) by mouth 2 (two) times daily with a meal.   ibuprofen  (ADVIL ) 800 MG tablet Take 1 tablet (800 mg total) by mouth every 8 (eight) hours as needed for mild pain, moderate pain or cramping.   lisinopril (ZESTRIL) 5 MG tablet 1 tab Orally Once a day; Duration: 30 days   [DISCONTINUED] budesonide -formoterol  (SYMBICORT ) 160-4.5 MCG/ACT inhaler Inhale 2 puffs into the lungs 2 (two) times daily.   metoprolol  tartrate (LOPRESSOR ) 100 MG tablet Take 2 hours prior to CT scan   No facility-administered encounter medications on file as of 05/21/2024.     Review of Systems  Review of Systems  No chest pain with exertion.  No orthopnea or PND.  Comprehensive review of system otherwise negative. Physical Exam  BP (!) 152/98   Pulse 79   LMP 10/27/2022 (Exact Date)   SpO2 97%   Wt Readings from Last 5 Encounters:  04/22/24 170 lb (77.1 kg)  03/20/24 175 lb 3.2 oz (79.5 kg)  11/20/23 171 lb (77.6 kg)  11/02/23 173 lb  4 oz (78.6 kg)  11/09/22 164 lb 12.8 oz (74.8 kg)    BMI Readings from Last 5 Encounters:  04/22/24 28.29 kg/m  03/20/24 29.15 kg/m  11/20/23 28.46 kg/m  11/02/23 28.83 kg/m  11/09/22 27.42 kg/m     Physical Exam General: Sitting in chair, no acute distress Eyes: No icterus Neck: Supple, swelling of right anterior neck noted  Pulmonary: Clear, normal work of breathing Cardiovascular: Rate and rhythm Abdomen: Nondistended   Assessment & Plan:   Dyspnea on exertion: Unclear etiology.  Only occurs with exertion and relieved quickly with rest.  On further questioning not sure this really dyspnea.  Her chief complaint continues to be repeated throat discomfort.  With exertion.  Not improved with trial of Symbicort .  Stop inhalers.  If symptoms were to worsen would resume.  Assess if symptoms improve after thyroid  surgery.  If not can escalate to triple inhaled therapy but no improvement with inhalers as not encouraging that this is a true pulmonary issue.  Her pulmonary parenchyma is clear on imaging.  No evidence of cardiac abnormality.  Unfortunate, unable to complete PFTs for further evaluation.  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  Assess if improvement in overall symptoms after thyroid  surgery.   Return if symptoms worsen or fail to improve.   Donnice JONELLE Beals, MD 05/21/2024

## 2024-05-21 NOTE — Patient Instructions (Signed)
 Patient could not perform test today. Attempted FVC more than 10 times, but patient stated she could not do it.

## 2024-05-22 ENCOUNTER — Telehealth (INDEPENDENT_AMBULATORY_CARE_PROVIDER_SITE_OTHER): Payer: Self-pay | Admitting: Otolaryngology

## 2024-05-22 NOTE — Telephone Encounter (Signed)
 Left the patient a message to see which provider she will be having surgery with- Dr. Tobie or [facility]NT.

## 2024-05-31 ENCOUNTER — Other Ambulatory Visit: Payer: Self-pay | Admitting: Otolaryngology

## 2024-06-17 NOTE — Progress Notes (Signed)
 Surgical Instructions   Your procedure is scheduled on December 19. Report to [facility] Main Entrance A at 8:45 A.M., then check in with the Admitting office. Any questions or running late day of surgery: call [phone]  Questions prior to your surgery date: call [phone], Monday-Friday, 8am-4pm. If you experience any cold or flu symptoms such as cough, fever, chills, shortness of breath, etc. between now and your scheduled surgery, please notify us  at the above number.     Remember:  Do not eat after midnight the night before your surgery   You may drink clear liquids until 7:45am the morning of your surgery.   Clear liquids allowed are: Water, Non-Citrus Juices (without pulp), Carbonated Beverages, Clear Tea (no milk, honey, etc.), Black Coffee Only (NO MILK, CREAM OR POWDERED CREAMER of any kind), and Gatorade.    Take these medicines the morning of surgery with A SIP OF WATER  carvedilol  (COREG )    May take these medicines IF NEEDED: albuterol  (VENTOLIN  HFA) inhaler- please bring inhaler to the hospital. SUMAtriptan  (IMITREX )   One week prior to surgery, STOP taking any Aspirin (unless otherwise instructed by your surgeon) Aleve , Naproxen , Ibuprofen , Motrin , Advil , Goody's, BC's, all herbal medications, fish oil, and non-prescription vitamins.                     Do NOT Smoke (Tobacco/Vaping) for 24 hours prior to your procedure.  If you use a CPAP at night, you may bring your mask/headgear for your overnight stay.   You will be asked to remove any contacts, glasses, piercing's, hearing aid's, dentures/partials prior to surgery. Please bring cases for these items if needed.    Patients discharged the day of surgery will not be allowed to drive home, and someone needs to stay with them for 24 hours.  SURGICAL WAITING ROOM VISITATION Patients may have no more than 2 support people in the waiting area - these visitors may rotate.   Pre-op nurse will coordinate an  appropriate time for 1 ADULT support person, who may not rotate, to accompany patient in pre-op.  Children under the age of 23 must have an adult with them who is not the patient and must remain in the main waiting area with an adult.  If the patient needs to stay at the hospital during part of their recovery, the visitor guidelines for inpatient rooms apply.  Please refer to the [facility] website for the visitor guidelines for any additional information.   If you received a COVID test during your pre-op visit  it is requested that you wear a mask when out in public, stay away from anyone that may not be feeling well and notify your surgeon if you develop symptoms. If you have been in contact with anyone that has tested positive in the last 10 days please notify you surgeon.      Pre-operative CHG Bathing Instructions   You can play a key role in reducing the risk of infection after surgery. Your skin needs to be as free of germs as possible. You can reduce the number of germs on your skin by washing with CHG (chlorhexidine  gluconate) soap before surgery. CHG is an antiseptic soap that kills germs and continues to kill germs even after washing.   DO NOT use if you have an allergy to chlorhexidine /CHG or antibacterial soaps. If your skin becomes reddened or irritated, stop using the CHG and notify one of our RNs at [phone].  TAKE A SHOWER THE NIGHT BEFORE SURGERY   Please keep in mind the following:  DO NOT shave, including legs and underarms, 48 hours prior to surgery.   You may shave your face before/day of surgery.  Place clean sheets on your bed the night before surgery Use a clean washcloth (not used since being washed) for shower. DO NOT sleep with pet's night before surgery.  CHG Shower Instructions:  Wash your face and private area with normal soap. If you choose to wash your hair, wash first with your normal shampoo.  After you use shampoo/soap, rinse your  hair and body thoroughly to remove shampoo/soap residue.  Turn the water OFF and apply half the bottle of CHG soap to a CLEAN washcloth.  Apply CHG soap ONLY FROM YOUR NECK DOWN TO YOUR TOES (washing for 3-5 minutes)  DO NOT use CHG soap on face, private areas, open wounds, or sores.  Pay special attention to the area where your surgery is being performed.  If you are having back surgery, having someone wash your back for you may be helpful. Wait 2 minutes after CHG soap is applied, then you may rinse off the CHG soap.  Pat dry with a clean towel  Put on clean pajamas    Additional instructions for the day of surgery: If you choose, you may shower the morning of surgery with an antibacterial soap.  DO NOT APPLY any lotions, deodorants, cologne, or perfumes.   Do not wear jewelry or makeup Do not wear nail polish, gel polish, artificial nails, or any other type of covering on natural nails (fingers and toes) Do not bring valuables to the hospital. [facility] is not responsible for valuables/personal belongings. Put on clean/comfortable clothes.  Please brush your teeth.  Ask your nurse before applying any prescription medications to the skin.   D

## 2024-06-18 ENCOUNTER — Encounter (HOSPITAL_COMMUNITY): Payer: Self-pay

## 2024-06-18 ENCOUNTER — Inpatient Hospital Stay (HOSPITAL_COMMUNITY): Admission: RE | Admit: 2024-06-18 | Discharge: 2024-06-18 | Attending: Otolaryngology

## 2024-06-18 ENCOUNTER — Other Ambulatory Visit: Payer: Self-pay

## 2024-06-18 VITALS — BP 140/90 | HR 88 | Temp 98.5°F | Resp 18 | Ht 66.0 in | Wt 174.8 lb

## 2024-06-18 DIAGNOSIS — Z01812 Encounter for preprocedural laboratory examination: Secondary | ICD-10-CM | POA: Diagnosis not present

## 2024-06-18 DIAGNOSIS — Z01818 Encounter for other preprocedural examination: Secondary | ICD-10-CM

## 2024-06-18 LAB — CBC
HCT: 39.5 % (ref 36.0–46.0)
Hemoglobin: 12.6 g/dL (ref 12.0–15.0)
MCH: 28 pg (ref 26.0–34.0)
MCHC: 31.9 g/dL (ref 30.0–36.0)
MCV: 87.8 fL (ref 80.0–100.0)
Platelets: 349 K/uL (ref 150–400)
RBC: 4.5 MIL/uL (ref 3.87–5.11)
RDW: 12.6 % (ref 11.5–15.5)
WBC: 8.2 K/uL (ref 4.0–10.5)
nRBC: 0 % (ref 0.0–0.2)

## 2024-06-18 LAB — BASIC METABOLIC PANEL WITH GFR
Anion gap: 13 (ref 5–15)
BUN: 10 mg/dL (ref 6–20)
CO2: 24 mmol/L (ref 22–32)
Calcium: 9.5 mg/dL (ref 8.9–10.3)
Chloride: 102 mmol/L (ref 98–111)
Creatinine, Ser: 0.77 mg/dL (ref 0.44–1.00)
GFR, Estimated: >60 mL/min (ref >60)
Glucose, Bld: 129 mg/dL — ABNORMAL HIGH (ref 70–99)
Potassium: 3.7 mmol/L (ref 3.5–5.1)
Sodium: 138 mmol/L (ref 135–145)

## 2024-06-18 NOTE — Progress Notes (Signed)
 PCP - ARNETT SUN  Cardiologist - Santo Stanly LABOR, MD   PPM/ICD - denies Device Orders - n/a Rep Notified - n/a  Chest x-ray -  EKG - 11-14-23 (CE REQUESTED) Stress Test -  ECHO - 12-27-23 Cardiac Cath -   Sleep Study - denies CPAP - n/a  DM -denies  Blood Thinner Instructions:denies Aspirin Instructions:denies  ERAS Protcol - Clear liquids until 7:45 am   COVID TEST- n/a   Anesthesia review: Yes, HTN and requested EKG  Patient denies shortness of breath, fever, cough and chest pain at PAT appointment   All instructions explained to the patient, with a verbal understanding of the material. Patient agrees to go over the instructions while at home for a better understanding. Patient also instructed to self quarantine after being tested for COVID-19. The opportunity to ask questions was provided.

## 2024-06-20 DIAGNOSIS — A609 Anogenital herpesviral infection, unspecified: Secondary | ICD-10-CM | POA: Diagnosis not present

## 2024-06-20 DIAGNOSIS — N898 Other specified noninflammatory disorders of vagina: Secondary | ICD-10-CM | POA: Diagnosis not present

## 2024-06-21 ENCOUNTER — Ambulatory Visit (HOSPITAL_COMMUNITY): Admitting: Anesthesiology

## 2024-06-21 ENCOUNTER — Encounter (HOSPITAL_COMMUNITY): Payer: Self-pay | Admitting: Otolaryngology

## 2024-06-21 ENCOUNTER — Other Ambulatory Visit: Payer: Self-pay

## 2024-06-21 ENCOUNTER — Ambulatory Visit (HOSPITAL_COMMUNITY): Payer: Self-pay | Admitting: Physician Assistant

## 2024-06-21 ENCOUNTER — Observation Stay (HOSPITAL_COMMUNITY)
Admission: RE | Admit: 2024-06-21 | Discharge: 2024-06-22 | Disposition: A | Discharge status: Home / Self Care | Source: Ambulatory Visit | Attending: Otolaryngology | Admitting: Otolaryngology

## 2024-06-21 ENCOUNTER — Encounter: Admission: RE | Disposition: A | Payer: Self-pay | Attending: Otolaryngology

## 2024-06-21 DIAGNOSIS — E041 Nontoxic single thyroid nodule: Principal | ICD-10-CM | POA: Diagnosis present

## 2024-06-21 DIAGNOSIS — E119 Type 2 diabetes mellitus without complications: Secondary | ICD-10-CM | POA: Diagnosis not present

## 2024-06-21 DIAGNOSIS — I10 Essential (primary) hypertension: Secondary | ICD-10-CM | POA: Insufficient documentation

## 2024-06-21 DIAGNOSIS — C73 Malignant neoplasm of thyroid gland: Secondary | ICD-10-CM | POA: Diagnosis not present

## 2024-06-21 DIAGNOSIS — Z79899 Other long term (current) drug therapy: Secondary | ICD-10-CM | POA: Insufficient documentation

## 2024-06-21 HISTORY — PX: THYROIDECTOMY: SHX17

## 2024-06-21 SURGERY — THYROIDECTOMY
Anesthesia: General | Site: Neck | Laterality: Right

## 2024-06-21 MED ORDER — HEMOSTATIC AGENTS (NO CHARGE) OPTIME
TOPICAL | Status: DC | PRN
  Administered 2024-06-21: 1

## 2024-06-21 MED ORDER — SODIUM CHLORIDE 0.9 % IV SOLN: INTRAVENOUS | Status: DC | PRN

## 2024-06-21 MED ORDER — ALBUTEROL SULFATE (2.5 MG/3ML) 0.083% IN NEBU: 2.5000 mg | INHALATION_SOLUTION | RESPIRATORY_TRACT | Status: DC | PRN

## 2024-06-21 MED ORDER — CHLORHEXIDINE GLUCONATE 0.12 % MT SOLN: 15.0000 mL | Freq: Once | OROMUCOSAL | Status: AC

## 2024-06-21 MED ORDER — PROPOFOL 10 MG/ML IV BOLUS
INTRAVENOUS | Status: AC
  Filled 2024-06-21: qty 20

## 2024-06-21 MED ORDER — CEFAZOLIN SODIUM-DEXTROSE 2-3 GM-%(50ML) IV SOLR
INTRAVENOUS | Status: DC | PRN
  Administered 2024-06-21: 2 g via INTRAVENOUS

## 2024-06-21 MED ORDER — 0.9 % SODIUM CHLORIDE (POUR BTL) OPTIME
TOPICAL | Status: DC | PRN
  Administered 2024-06-21: 1000 mL

## 2024-06-21 MED ORDER — POLYETHYLENE GLYCOL 3350 17 G PO PACK: 17.0000 g | PACK | Freq: Every day | ORAL | Status: DC | PRN

## 2024-06-21 MED ORDER — ALBUTEROL SULFATE HFA 108 (90 BASE) MCG/ACT IN AERS: 2.0000 | INHALATION_SPRAY | Freq: Four times a day (QID) | RESPIRATORY_TRACT | Status: DC | PRN

## 2024-06-21 MED ORDER — CARVEDILOL 3.125 MG PO TABS
3.1250 mg | ORAL_TABLET | Freq: Two times a day (BID) | ORAL | Status: DC
  Administered 2024-06-22: 3.125 mg via ORAL
  Filled 2024-06-21: qty 1

## 2024-06-21 MED ORDER — ACETAMINOPHEN 325 MG PO TABS: 650.0000 mg | ORAL_TABLET | Freq: Four times a day (QID) | ORAL | Status: DC | PRN

## 2024-06-21 MED ORDER — SUCCINYLCHOLINE CHLORIDE 200 MG/10ML IV SOSY
PREFILLED_SYRINGE | INTRAVENOUS | Status: DC | PRN
  Administered 2024-06-21: 140 mg via INTRAVENOUS

## 2024-06-21 MED ORDER — HYDROCODONE-ACETAMINOPHEN 5-325 MG PO TABS
1.0000 | ORAL_TABLET | ORAL | Status: DC | PRN
  Administered 2024-06-21 – 2024-06-22 (×3): 2 via ORAL
  Filled 2024-06-21 (×4): qty 2

## 2024-06-21 MED ORDER — LIDOCAINE-EPINEPHRINE 1 %-1:100000 IJ SOLN
INTRAMUSCULAR | Status: DC | PRN
  Administered 2024-06-21: 10 mL

## 2024-06-21 MED ORDER — SCOPOLAMINE 1 MG/3DAYS TD PT72
1.0000 | MEDICATED_PATCH | TRANSDERMAL | Status: DC
  Administered 2024-06-21: 1 mg via TRANSDERMAL
  Filled 2024-06-21: qty 1

## 2024-06-21 MED ORDER — LACTATED RINGERS IV SOLN: INTRAVENOUS | Status: DC

## 2024-06-21 MED ORDER — ONDANSETRON HCL 4 MG/2ML IJ SOLN: 4.0000 mg | Freq: Once | INTRAMUSCULAR | Status: DC | PRN

## 2024-06-21 MED ORDER — FENTANYL CITRATE (PF) 250 MCG/5ML IJ SOLN
INTRAMUSCULAR | Status: DC | PRN
  Administered 2024-06-21: 100 ug via INTRAVENOUS
  Administered 2024-06-21 (×2): 50 ug via INTRAVENOUS

## 2024-06-21 MED ORDER — MIDAZOLAM HCL 2 MG/2ML IJ SOLN
INTRAMUSCULAR | Status: AC
  Filled 2024-06-21: qty 2

## 2024-06-21 MED ORDER — HYDROMORPHONE HCL 1 MG/ML IJ SOLN
INTRAMUSCULAR | Status: AC
  Filled 2024-06-21: qty 1

## 2024-06-21 MED ORDER — MIDAZOLAM HCL (PF) 2 MG/2ML IJ SOLN
INTRAMUSCULAR | Status: DC | PRN
  Administered 2024-06-21: 2 mg via INTRAVENOUS

## 2024-06-21 MED ORDER — ORAL CARE MOUTH RINSE: 15.0000 mL | Freq: Once | OROMUCOSAL | Status: AC

## 2024-06-21 MED ORDER — LIDOCAINE-EPINEPHRINE 1 %-1:100000 IJ SOLN
INTRAMUSCULAR | Status: AC
  Filled 2024-06-21: qty 1

## 2024-06-21 MED ORDER — SUMATRIPTAN SUCCINATE 100 MG PO TABS: 100.0000 mg | ORAL_TABLET | Freq: Two times a day (BID) | ORAL | Status: DC | PRN

## 2024-06-21 MED ORDER — OXYCODONE HCL 5 MG/5ML PO SOLN: 5.0000 mg | Freq: Once | ORAL | Status: DC | PRN

## 2024-06-21 MED ORDER — ACETAMINOPHEN 500 MG PO TABS
1000.0000 mg | ORAL_TABLET | Freq: Once | ORAL | Status: AC
  Administered 2024-06-21: 1000 mg via ORAL
  Filled 2024-06-21: qty 2

## 2024-06-21 MED ORDER — FENTANYL CITRATE (PF) 250 MCG/5ML IJ SOLN
INTRAMUSCULAR | Status: AC
  Filled 2024-06-21: qty 5

## 2024-06-21 MED ORDER — SODIUM CHLORIDE 0.9 % IV SOLN
0.1500 ug/kg/min | INTRAVENOUS | Status: AC
  Administered 2024-06-21: .1 ug/kg/min via INTRAVENOUS
  Filled 2024-06-21: qty 2000

## 2024-06-21 MED ORDER — IBUPROFEN 800 MG PO TABS
800.0000 mg | ORAL_TABLET | Freq: Three times a day (TID) | ORAL | Status: DC
  Administered 2024-06-21 – 2024-06-22 (×3): 800 mg via ORAL
  Filled 2024-06-21 (×3): qty 1

## 2024-06-21 MED ORDER — LIDOCAINE 2% (20 MG/ML) 5 ML SYRINGE
INTRAMUSCULAR | Status: DC | PRN
  Administered 2024-06-21: 60 mg via INTRAVENOUS

## 2024-06-21 MED ORDER — HYDROMORPHONE HCL 1 MG/ML IJ SOLN
0.2500 mg | INTRAMUSCULAR | Status: DC | PRN
  Administered 2024-06-21 (×2): 0.5 mg via INTRAVENOUS

## 2024-06-21 MED ORDER — SODIUM CHLORIDE 0.9 % IV SOLN
0.1500 ug/kg/min | INTRAVENOUS | Status: DC
  Filled 2024-06-21: qty 2000

## 2024-06-21 MED ORDER — OXYCODONE HCL 5 MG PO TABS: 5.0000 mg | ORAL_TABLET | Freq: Once | ORAL | Status: DC | PRN

## 2024-06-21 MED ORDER — ONDANSETRON HCL 4 MG PO TABS: 4.0000 mg | ORAL_TABLET | ORAL | Status: DC | PRN

## 2024-06-21 MED ORDER — LACTATED RINGERS IV SOLN: INTRAVENOUS | Status: DC | PRN

## 2024-06-21 MED ORDER — EPINEPHRINE HCL (NASAL) 0.1 % NA SOLN
NASAL | Status: AC
  Filled 2024-06-21: qty 30

## 2024-06-21 MED ORDER — PROPOFOL 10 MG/ML IV BOLUS
INTRAVENOUS | Status: DC | PRN
  Administered 2024-06-21: 200 mg via INTRAVENOUS

## 2024-06-21 MED ORDER — DEXAMETHASONE SOD PHOSPHATE PF 10 MG/ML IJ SOLN
INTRAMUSCULAR | Status: DC | PRN
  Administered 2024-06-21: 10 mg via INTRAVENOUS

## 2024-06-21 MED ORDER — ONDANSETRON HCL 4 MG/2ML IJ SOLN
INTRAMUSCULAR | Status: DC | PRN
  Administered 2024-06-21: 4 mg via INTRAVENOUS

## 2024-06-21 MED ORDER — PHENYLEPHRINE HCL-NACL 20-0.9 MG/250ML-% IV SOLN
INTRAVENOUS | Status: DC | PRN
  Administered 2024-06-21: 20 ug/min via INTRAVENOUS
  Administered 2024-06-21: 35 ug/min via INTRAVENOUS

## 2024-06-21 MED ORDER — PHENYLEPHRINE 80 MCG/ML (10ML) SYRINGE FOR IV PUSH (FOR BLOOD PRESSURE SUPPORT)
PREFILLED_SYRINGE | INTRAVENOUS | Status: DC | PRN
  Administered 2024-06-21: 80 ug via INTRAVENOUS

## 2024-06-21 MED ORDER — CHLORHEXIDINE GLUCONATE 0.12 % MT SOLN
OROMUCOSAL | Status: AC
  Administered 2024-06-21: 15 mL via OROMUCOSAL
  Filled 2024-06-21: qty 15

## 2024-06-21 MED ORDER — ONDANSETRON HCL 4 MG/2ML IJ SOLN
4.0000 mg | INTRAMUSCULAR | Status: DC | PRN
  Administered 2024-06-21: 4 mg via INTRAVENOUS
  Filled 2024-06-21: qty 2

## 2024-06-21 MED ORDER — AMISULPRIDE (ANTIEMETIC) 5 MG/2ML IV SOLN: 10.0000 mg | Freq: Once | INTRAVENOUS | Status: DC | PRN

## 2024-06-21 SURGICAL SUPPLY — 38 items
BAG COUNTER SPONGE SURGICOUNT (BAG) ×1 IMPLANT
BLADE SURG 15 STRL LF DISP TIS (BLADE) ×1 IMPLANT
CANISTER SUCTION 3000ML PPV (SUCTIONS) ×1 IMPLANT
CNTNR URN SCR LID CUP LEK RST (MISCELLANEOUS) IMPLANT
CORD BIPOLAR FORCEPS 12FT (ELECTRODE) ×1 IMPLANT
COVER SURGICAL LIGHT HANDLE (MISCELLANEOUS) ×1 IMPLANT
DERMABOND ADVANCED .7 DNX12 (GAUZE/BANDAGES/DRESSINGS) ×1 IMPLANT
DRAPE HALF SHEET 40X57 (DRAPES) IMPLANT
DRAPE UTILITY XL STRL (DRAPES) ×1 IMPLANT
ELECT COATED BLADE 2.86 ST (ELECTRODE) ×1 IMPLANT
ELECTRODE REM PT RTRN 9FT ADLT (ELECTROSURGICAL) ×1 IMPLANT
FORCEPS BIPOLAR SPETZLER 8 1.0 (NEUROSURGERY SUPPLIES) ×1 IMPLANT
GAUZE 4X4 16PLY ~~LOC~~+RFID DBL (SPONGE) ×1 IMPLANT
GLOVE BIO SURGEON STRL SZ 6.5 (GLOVE) ×1 IMPLANT
GOWN STRL REUS W/ TWL LRG LVL3 (GOWN DISPOSABLE) ×2 IMPLANT
HEMOSTAT SNOW SURGICEL 2X4 (HEMOSTASIS) IMPLANT
KIT BASIN OR (CUSTOM PROCEDURE TRAY) ×1 IMPLANT
KIT TURNOVER KIT B (KITS) ×1 IMPLANT
NDL HYPO 25GX1X1/2 BEV (NEEDLE) ×1 IMPLANT
NEEDLE HYPO 25GX1X1/2 BEV (NEEDLE) ×1 IMPLANT
PAD ARMBOARD POSITIONER FOAM (MISCELLANEOUS) ×2 IMPLANT
PATTIES SURGICAL .5 X.5 (GAUZE/BANDAGES/DRESSINGS) IMPLANT
PENCIL SMOKE EVACUATOR (MISCELLANEOUS) ×1 IMPLANT
POSITIONER HEAD DONUT 9IN (MISCELLANEOUS) IMPLANT
PROBE NERVBE PRASS .33 (MISCELLANEOUS) ×1 IMPLANT
RETRACTOR STAY HOOK 5MM (MISCELLANEOUS) ×1 IMPLANT
SET WALTER ACTIVATION W/DRAPE (SET/KITS/TRAYS/PACK) ×1 IMPLANT
SHEARS HARMONIC 9CM CVD (BLADE) ×1 IMPLANT
SOLN 0.9% NACL POUR BTL 1000ML (IV SOLUTION) ×1 IMPLANT
SPONGE INTESTINAL PEANUT (DISPOSABLE) ×1 IMPLANT
STAPLER SKIN PROX 35W (STAPLE) IMPLANT
SUT MNCRL AB 4-0 PS2 18 (SUTURE) ×1 IMPLANT
SUT SILK 3 0 REEL (SUTURE) ×1 IMPLANT
SUT SILK 3 0SH CR/8 30 (SUTURE) ×1 IMPLANT
SUT VIC AB 3-0 SH 27X BRD (SUTURE) ×1 IMPLANT
SUT VIC AB 4-0 PS2 18 (SUTURE) ×1 IMPLANT
TOWEL GREEN STERILE FF (TOWEL DISPOSABLE) ×1 IMPLANT
TRAY ENT MC OR (CUSTOM PROCEDURE TRAY) ×1 IMPLANT

## 2024-06-21 NOTE — Transfer of Care (Signed)
 Immediate Anesthesia Transfer of Care Note  Patient: Danielle [name]  Procedure(s) Performed: RIGHT HEMITHYROIDECTOMY (Right: Neck)  Patient Location: PACU  Anesthesia Type:General  Level of Consciousness: drowsy and patient cooperative  Airway & Oxygen Therapy: Patient Spontanous Breathing and Patient connected to face mask oxygen  Post-op Assessment: Report given to RN, Post -op Vital signs reviewed and stable, Patient moving all extremities, and Patient moving all extremities X 4  Post vital signs: Reviewed and stable  Last Vitals:  Vitals Value Taken Time  BP 168/92 06/21/24 14:47  Temp    Pulse 111 06/21/24 14:49  Resp 18 06/21/24 14:49  SpO2 100 % 06/21/24 14:49  Vitals shown include unfiled device data.  Last Pain:  Vitals:   06/21/24 0917  TempSrc:   PainSc: 0-No pain         Complications: No notable events documented.

## 2024-06-21 NOTE — Anesthesia Procedure Notes (Signed)
 Procedure Name: Intubation Date/Time: 06/21/2024 11:06 AM  Performed by: Arvell Edsel HERO, CRNAPre-anesthesia Checklist: Patient identified, Emergency Drugs available, Suction available, Patient being monitored and Timeout performed Patient Re-evaluated:Patient Re-evaluated prior to induction Oxygen Delivery Method: Circle system utilized Preoxygenation: Pre-oxygenation with 100% oxygen Induction Type: IV induction Ventilation: Two handed mask ventilation required Laryngoscope Size: Glidescope and 3 Grade View: Grade I Tube type: Oral Tube size: 7.0 mm Number of attempts: 1 Airway Equipment and Method: Patient positioned with wedge pillow and Video-laryngoscopy Placement Confirmation: ETT inserted through vocal cords under direct vision, positive ETCO2 and breath sounds checked- equal and bilateral Tube secured with: Tape Dental Injury: Teeth and Oropharynx as per pre-operative assessment

## 2024-06-21 NOTE — H&P (Signed)
 Danielle [name] is an 53 y.o. female.    Chief Complaint:  Right thyroid  nodule  HPI: Patient presents today for planned elective procedure. She denies any interval change in history since office visit on 04/29/24:  Danielle [name] is a 53 y.o. female who presents as a new patient, who presents for a second opinion regarding a nodule on the right side of her thyroid .  She reports experiencing a burning sensation in her throat during physical activities such as walking or climbing stairs, which necessitates her to pause and recover. She describes the sensation as painful rather than a feeling of shortness of breath. A CT scan conducted by her cardiologist revealed a nodule, prompting a referral to Dr. Annella, a pulmonologist. The pulmonologist's findings were normal, but a thyroid  nodule nodule was identified during the CT scan.  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 She is scheduled for a thyroidectomy in the upcoming months. She was referred to an ENT specialist and an oncologist. The oncologist informed her that due to the type of cancer, she would not require treatment at their office but would possibly need radioactive iodine. She was then referred back to Dr. Delayne for an endocrinologist referral. The endocrinologist advised her to schedule an appointment.  Dr. Tobie performed a laryngoscopy and suggested that her symptoms might not be related to the thyroid  nodule. She is seeking a second opinion due to her primary concern about the cancer diagnosis. She has a pulmonary function test scheduled for next month and is considering whether to proceed with it. She has not yet consulted with an endocrinologist.  She reports no history of keloid formation from previous incisions. She is not currently taking any blood-thinning medications but is on medication for blood pressure. She is not currently under the care  of a cardiologist.   Past Medical History:  Diagnosis Date   Bacterial vaginosis    RECURRENT   Difficult intravenous access    Difficult IV stick.   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   Heart murmur    HSV (herpes simplex virus) anogenital infection    Hypertension    Migraines    takes OTC   Pre-diabetes 2019   04/17/18 HgbA1c 6.2   Wears glasses    readers only    Past Surgical History:  Procedure Laterality Date   BREAST BIOPSY Right 01/21/2022   right breast core needle biopsy / pathology=fibroadenoma, neg for malignancy   CESAREAN SECTION  12/26/2000   CHOLECYSTECTOMY N/A 04/06/2019   Procedure: LAPAROSCOPIC CHOLECYSTECTOMY;  Surgeon: Vanderbilt Ned, MD;  Location: [facility] OR;  Service: General;  Laterality: N/A;   CYST EXCISION N/A 04/21/2017   Procedure: EXCISION OF SEBACEOUS CYST OF BILATERAL THIGHS AND LEFT LABIA;  Surgeon: Signe Mitzie LABOR, MD;  Location: [facility];  Service: General;  Laterality: N/A;   CYSTOSCOPY  11/09/2022   Procedure: CYSTOSCOPY;  Surgeon: Storm Setter, DO;  Location: [facility];  Service: Gynecology;;   INCISE AND DRAIN ABCESS  09/23/2009   LEFT LABIAL-SKENE    LAPAROTOMY  11/09/2022   Procedure: GELPORT ASSISTED MINI LAPAROTOMY;  Surgeon: Storm Setter, DO;  Location: [facility];  Service: Gynecology;;   ROBOTIC ASSISTED LAPAROSCOPIC HYSTERECTOMY AND SALPINGECTOMY Bilateral 11/09/2022   Procedure: XI ROBOTIC ASSISTED LAPAROSCOPIC HYSTERECTOMY AND SALPINGECTOMY;  Surgeon: Storm Setter, DO;  Location: [facility];  Service: Gynecology;  Laterality: Bilateral;  TUBAL LIGATION  07/04/2007    Family History  Problem Relation Age of Onset   Diabetes Mother    Hypertension Mother    Diabetes Father     Social History:  reports that she has never smoked. She has never used smokeless tobacco. She reports current alcohol use. She reports that she does not use  drugs.  Allergies: Allergies[1]  Medications Prior to Admission  Medication Sig Dispense Refill   albuterol  (VENTOLIN  HFA) 108 (90 Base) MCG/ACT inhaler Inhale 2 puffs into the lungs every 6 (six) hours as needed for wheezing or shortness of breath. 8 g 6   carvedilol  (COREG ) 3.125 MG tablet Take 1 tablet (3.125 mg total) by mouth 2 (two) times daily with a meal. 180 tablet 3   ibuprofen  (ADVIL ) 800 MG tablet Take 1 tablet (800 mg total) by mouth every 8 (eight) hours as needed for mild pain, moderate pain or cramping. (Patient not taking: Reported on 06/14/2024) 30 tablet 1   SUMAtriptan  (IMITREX ) 100 MG tablet Take 100 mg by mouth every 2 (two) hours as needed for migraine.      No results found for this or any previous visit (from the past 48 hours). No results found.  ROS: ROS  Blood pressure (!) 158/88, pulse 81, temperature 98.9 F (37.2 C), temperature source Oral, resp. rate 18, height 5' 6 (1.676 m), weight 79.8 kg, last menstrual period 10/27/2022, SpO2 99%.  PHYSICAL EXAM: Physical Exam Constitutional:      Appearance: Normal appearance.  Pulmonary:     Effort: Pulmonary effort is normal.  Neurological:     General: No focal deficit present.     Mental Status: She is alert.  Psychiatric:        Mood and Affect: Mood normal.     Studies Reviewed: Thyroid  FNA, Afirma Results, Thyroid  US    Assessment/Plan Danielle [name] is a 53 y.o. 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 Afirma with risk of malignancy 75%.   Patient was previously counseled regarding options, to include right hemithyroidectomy with possible completion thyroidectomy at a later date pending final pathology, versus total thyroidectomy. Risks, benefits of each option were comprehensively reviewed again. Patient would like to proceed with right hemi thyroidectomy. All questions were  answered, and consent was modified as per patient's preference.     Danielle [name] A Amaurie Wandel 06/21/2024, 9:20 AM       [1]  Allergies Allergen Reactions   Septra [Bactrim] Nausea And Vomiting   Sulfur     Other Reaction(s): Unknown   Flagyl  [Metronidazole ] Nausea Only

## 2024-06-21 NOTE — Anesthesia Postprocedure Evaluation (Signed)
"   Anesthesia Post Note  Patient: Danielle [name]  Procedure(s) Performed: RIGHT HEMITHYROIDECTOMY (Right: Neck)     Patient location during evaluation: PACU Anesthesia Type: General Level of consciousness: awake and alert, oriented and patient cooperative Pain management: pain level controlled Vital Signs Assessment: post-procedure vital signs reviewed and stable Respiratory status: spontaneous breathing, nonlabored ventilation and respiratory function stable Cardiovascular status: blood pressure returned to baseline and stable Postop Assessment: no apparent nausea or vomiting Anesthetic complications: no   No notable events documented.  Last Vitals:  Vitals:   06/21/24 1600 06/21/24 1615  BP: (!) 166/85 (!) 161/90  Pulse: 93 94  Resp: (!) 22 (!) 21  Temp:    SpO2: 99% 99%    Last Pain:  Vitals:   06/21/24 1615  TempSrc:   PainSc: Asleep    LLE Motor Response: Purposeful movement (06/21/24 1615) LLE Sensation: Full sensation (06/21/24 1615) RLE Motor Response: Purposeful movement (06/21/24 1615) RLE Sensation: Full sensation (06/21/24 1615)      Almarie HERO Dala Breault      "

## 2024-06-21 NOTE — Op Note (Signed)
 OPERATIVE NOTE  Danielle [name] Date/Time of Admission: 06/21/2024  8:46 AM  CSN: [id number];FMW:[id number] Attending Provider: Llewellyn Sayres A, DO Room/Bed: MCPO/NONE DOB: 1970-09-11 Age: 53 y.o.   Pre-Op Diagnosis: Nodule of right lobe of thyroid  gland  Post-Op Diagnosis: Nodule of right lobe of thyroid  gland  Procedure: Procedures: RIGHT HEMITHYROIDECTOMY  Anesthesia: General  Surgeon(s): Rayaan Lorah A Sederick Jacobsen, DO  Staff: Circulator: Storm Maryelizabeth CROME, RN Relief Scrub: Debby Catheryn HERO, RN Scrub Person: Gladis Figures RN First Assistant: Catarina Keven CROME, RN  Implants: * No implants in log *  Specimens: ID Type Source Tests Collected by Time Destination  1 : Right thyroid  nodule (stitch marks superior) Tissue PATH ENT excision SURGICAL PATHOLOGY Shanay Woolman A, DO 06/21/2024 1400     Complications: None  EBL: 20 ML  Condition: stable  Operative Findings:  2 of  the 2 right parathyroid gland were identified and appeared  nerurovascularly intact. The right recurrent laryngeal nerves was intact to visual examination.  Description of Operation: The patient was identified in the preoperative holding area.  Informed written consent including risks, benefits, alternatives, and possible complications including bleeding and nerve injury were obtained.  Patient was then taken, under the care of anesthesia, to the operating suite.  Patient was placed in the supine position on the operating table and then general anesthesia was administered using a NIMS endotracheal tube per anesthesia's protocol.  Incision site on anterior neck was marked and infiltrated with 10cc Lidocaine  1% with 1:100,000 epinephrine .  A transverse incision, approximately 6cm in length was made within a skin crease with a 15 blade scalpel. The incision was carried down through the platysma. Inferiorly and superiorly based subplatysmal flaps were raised to the level of the clavicle and thyroid   cartilage, respectively.  Four 2-0 silk sutures were used to retract the skin flaps at each corner of the incision. The midline raphe of the strap muscles was then identified and separated with the Bovie electrocautery in anatomic midline. The strap muscles were then dissected from the right  thyroid  capsule. The strap muscles were retracted laterally. The superior pole vasculature was then identified and surrounding tissue was dissected free using both sharp and blunt instrumentation.  The superior pole vasculature was then clamped, cut, and ligated using Harmonic Scalpel.  The inferior pole was then identified and surrounding tissue were dissected free using both sharp and blunt instrumentation.  The inferior pole vessels were then clamped, cut, and ligated using Harmonic Scalpel. The thyroid  was then retracted medially.  The middle thyroid  vein was identified and ligated.  The recurrent laryngeal was then identified in the tracheoesophageal groove.  The nerve integrity monitoring probe was then used to stimulate the recurrent laryngeal nerve to ensure its identification and integrity.  The nerve was carefully dissected superiorly until it entered the larynx.  The right   thyroid  was removed from the anterior tracheal wall through Berry's Ligament. The thyroid  lobe was then passed off the field to be evaluated by pathology as a permanent specimen. Spot bipolar electrocautery was used to achieve meticulous hemostasis, and the dissected nerve was once again stimulated and found to be functional.  The wound bed was copiously irrigated and once again inspected for any bleeding.  There was none.  Surgicel was placed in the wound bed.  The strap muscles were then reapproximated using a 3-0 chromic suture in a running, locking fashion. The incision was then closed in a layered fashion. The platysma was closed with interrupted 3-0 Vicryl sutures.  The subcutaneous tissue was then closed with buried interrupted 4-0 vicryl  sutures.  The skin was then closed with 4-0 Monocryl suture and Dermabond.  All counts were correct and the procedure was terminated. Patient was returned to the care of anesthesia and extubated without difficulty and transferred to PACU in stable condition.    Danielle DELENA Shope, DO [facility]  06/21/2024

## 2024-06-21 NOTE — Discharge Instructions (Signed)
 [facility] TH[facility]ID  [facility]GERY Post Operative Instructions Office Number [phone]  The Surgery Itself Thyroid  surgery involves general anesthesia. Patients may be quite sedated for several hours after surgery and may remain sleepy for much of the day. Nausea and vomiting is occasionally seen, and usually resolves by the evening of surgery - even without additional medications. Some patients stay one night in the hospital and are discharged the next day. Other patients can go home the evening of surgery. Many patients will have a drain in place after surgery-this is removed the following day before you go home from the hospital or in the office 1-3 days after surgery.   Your Incision Your incision is closed with absorbable sutures and is covered with a small strip of tape or skin glue. You can shower and wash your hair as usual starting 24-48 hours after your drain is removed, as directed by your surgeon. If you did not have a drain in place after surgery, you may shower 24 hours after surgery. You may wash in a bathtub prior to that time if you are careful not to get your neck wet. Do not soak or scrub the incision. You might notice bruising around your incision or upper chest and slight swelling above the scar when you are upright. In addition, the scar may become pink and hard. This hardening will peak at about 3 weeks and may result in some tightness or difficulty swallowing, which will disappear over the next 2 to 3 months. You should apply sunscreen on your incision once directed by your surgeon (usually starting one month after surgery) EVERY day for the first year after surgery. This will prevent a red or pink scar and give you the best cosmetic result for your scar. A daily moisturizer with sunscreen (example Oil of Olay with SPF 15) is fine.   Limitations You can start resuming normal activities as tolerated 7 days after surgery. For some patients, lifting can cause pain and stretching at  the surgery site for up to 2-3 weeks after surgery. You should not drive or drink alcohol while taking pain medications. Most people can return to work/school in 1-2 weeks after surgery, but there may be physical limitations as far as what you may do while at work.   Medications ? Pain medication should be used for pain as prescribed. Pain is expected after surgery. Your neck will be sore and pain will be worse when the neck is stretched and when you swallow. As the surgical site heals, pain will resolve over the course of a week. It is not uncommon for pain to get worse when you first go home because your activity may increase, but from that point on the pain should improve every day. Pain medications can cause nausea, which can be prevented if you take them with food or milk. ? You may be given a stool softener (Colace) because pain medications may make you  constipated. You can also use an over the counter stool softener.  ? You may be given Bacitracin ointment. If you are, it should be applied to the drain exit site three times a day for 2 days after the drain is removed. It should also be applied to the drain site before and after your first shower after surgery. It is normal to have some red or pink drainage from your drain exit site for 1-2 days after it is removed. ? If you were taking thyroid  hormone tablets before your operation, you will continue this  after surgery, but sometimes your surgeon will change the dose. If you were not taking thyroid  hormone prior to your operation, your surgeon may prescribe these tablets following surgery if the entire thyroid  is removed. During your post-operative visits, you may have a blood test to measure your levels of thyroid  hormone and your dose of medication may be adjusted accordingly. ? If you had parathyroid surgery or a total thyroidectomy, you may be instructed to take extra calcium supplements until your blood calcium levels stabilize. These usually  have to be purchased over the counter at a drug store and your surgeon will give you specific instructions. Generic brands are fine. Calcium carbonate with Vitamin D or TUMS are usually recommended. If you take any medications for gastric reflux (heartburn), you may be instructed to take Calcium Citrate with vitamin D instead of the other types of calcium. Your surgeon will instruct you on which type of calcium supplement to purchase and how many tablets to take each day after surgery. ? Take all of your routine medications as prescribed, unless told otherwise by your surgeon. Any medications which thin the blood should be avoided until your surgeon tells you to resume them.  ? IT IS OK TO TAKE OVER THE COUNTER PAIN MEDICATION (IBUPROFEN , NAPROXEN , or ACETAMINOPHEN ) IN ADDITION TO YOUR PRESCRIBED MEDICATIONS. DO NOT TAKE ASPIRIN UNLESS CLEARED WITH YOUR SURGEON.  ? Limit Acetaminophen /Tylenol  to less than 4,000mg /day  ? Limit Ibuprofen /Motrin  to less than 3,200mg /day  Pain The main complaint following thyroid  surgery is pain with swallowing and neck movement. Some people experience a dull ache, while others feel a sharp pain. This should not keep you from eating anything you want or moving your neck and will improve daily after surgery. It is normal to have a sore throat as well.   Voice Your voice may go through some temporary changes with fluctuations in volume and clarity (hoarseness). Generally, it will be better in the mornings and tire toward the end of the day. This can last for variable periods of time, but should clear in 8-10 weeks at most.   Cough You may feel like you have phlegm in your throat or a sore throat. This is usually because there was a tube in your windpipe while you were asleep that caused irritation that you perceive as phlegm. You will notice that if you cough, very little phlegm will come up. This should clear up in 4 to 5 days.  Hypocalcemia (Low blood calcium) In  some patients who have thyroid  and parathyroid surgery, the parathyroid glands do not function properly immediately after surgery. This is usually temporary and causes the blood calcium level to drop below normal (hypocalcemia). Symptoms of hypocalcemia include numbness and tingling of your lips (like they fell asleep), in your hands and in your feet. Some patients experience a crawling sensation in the skin, muscle cramps or headaches. These symptoms can appear between 24 and 72 hours after surgery. It is rare for them to start more than 72 hours after surgery. If this happens, take 2 extra calcium tablets. If the symptoms do not resolve within one hour, you should call your doctor. If this happens during the business day, call the nurse triage line. If this happens in the evening or over the weekend, call the on call physician or go to the ER so your blood calcium levels can be checked. You can take extra calcium supplements (which will help to resolve symptoms) as you are contacting the clinic or coming  to the ER. Taking extra calcium supplements if you do not need them will not cause you any harm.  Reasons to call your surgeons office ? Persistent fever over 101 F ? Increasing neck swelling ? Numbness or tingling around your mouth or fingertips  ? Pain that is not relieved by your medications ? Purulent drainage (pus) from the incision ? Redness surrounding the incision that is worsening or getting bigger ? Bleeding is possible after surgery and the most serious cases may cause trouble breathing. Symptoms include rapid swelling in the neck, trouble breathing, red and purple discoloration of the skin over the incision. If breathing difficulty occurs with this rapid neck swelling, call the doctor immediately, go to the closest emergency room or call 911.

## 2024-06-21 NOTE — Anesthesia Preprocedure Evaluation (Addendum)
"                                    Anesthesia Evaluation  Patient identified by MRN, date of birth, ID band Patient awake    Reviewed: Allergy & Precautions, NPO status , Patient's Chart, lab work & pertinent test results, reviewed documented beta blocker date and time   History of Anesthesia Complications (+) history of anesthetic complications (hx difficult PIV)  Airway Mallampati: III  TM Distance: >3 FB Neck ROM: Full    Dental  (+) Teeth Intact, Dental Advisory Given   Pulmonary  Hypodense nodule within the right thyroid  lobe measuring 3.2 cm. Snores at night, no sleep study    Pulmonary exam normal breath sounds clear to auscultation       Cardiovascular hypertension (158/88 preop, normally 140s SBP per pt), Pt. on medications and Pt. on home beta blockers Normal cardiovascular exam Rhythm:Regular Rate:Normal  Echo 2025 1. Left ventricular ejection fraction, by estimation, is 60 to 65%. Left  ventricular ejection fraction by 3D volume is 65 %. The left ventricle has  normal function. The left ventricle has no regional wall motion  abnormalities. Left ventricular diastolic   parameters were normal. The average left ventricular global longitudinal  strain is -24.2 %. The global longitudinal strain is normal.   2. Right ventricular systolic function is normal. The right ventricular  size is normal. There is normal pulmonary artery systolic pressure. The  estimated right ventricular systolic pressure is 15.2 mmHg.   3. The mitral valve is normal in structure. No evidence of mitral valve  regurgitation. No evidence of mitral stenosis.   4. The aortic valve is tricuspid. Aortic valve regurgitation is not  visualized. No aortic stenosis is present.   5. The inferior vena cava is normal in size with greater than 50%  respiratory variability, suggesting right atrial pressure of 3 mmHg.      Neuro/Psych  Headaches  negative psych ROS   GI/Hepatic negative GI  ROS, Neg liver ROS,,,  Endo/Other  diabetes (prediabetic)    Renal/GU negative Renal ROS  negative genitourinary   Musculoskeletal negative musculoskeletal ROS (+)    Abdominal   Peds  Hematology negative hematology ROS (+) Hb 12.6   Anesthesia Other Findings   Reproductive/Obstetrics S/p TAH 2024                              Anesthesia Physical Anesthesia Plan  ASA: 3  Anesthesia Plan: General   Post-op Pain Management:    Induction: Intravenous  PONV Risk Score and Plan: 4 or greater and Ondansetron , Dexamethasone , Midazolam , Treatment may vary due to age or medical condition and Scopolamine  patch - Pre-op  Airway Management Planned: Oral ETT  Additional Equipment: None  Intra-op Plan:   Post-operative Plan: Extubation in OR  Informed Consent: I have reviewed the patients History and Physical, chart, labs and discussed the procedure including the risks, benefits and alternatives for the proposed anesthesia with the patient or authorized representative who has indicated his/her understanding and acceptance.     Dental advisory given  Plan Discussed with: CRNA  Anesthesia Plan Comments:          Anesthesia Quick Evaluation  "

## 2024-06-22 ENCOUNTER — Encounter (HOSPITAL_COMMUNITY): Payer: Self-pay | Admitting: Otolaryngology

## 2024-06-22 DIAGNOSIS — I10 Essential (primary) hypertension: Secondary | ICD-10-CM | POA: Diagnosis not present

## 2024-06-22 DIAGNOSIS — C73 Malignant neoplasm of thyroid gland: Secondary | ICD-10-CM | POA: Diagnosis not present

## 2024-06-22 DIAGNOSIS — Z79899 Other long term (current) drug therapy: Secondary | ICD-10-CM | POA: Diagnosis not present

## 2024-06-22 DIAGNOSIS — E119 Type 2 diabetes mellitus without complications: Secondary | ICD-10-CM | POA: Diagnosis not present

## 2024-06-22 MED ORDER — HYDROCODONE-ACETAMINOPHEN 5-325 MG PO TABS: 1.0000 | ORAL_TABLET | Freq: Four times a day (QID) | ORAL | 0 refills | Status: AC | PRN

## 2024-06-22 NOTE — Progress Notes (Signed)
 Discharge   Patient expressed verbal understanding of discharge POC.   Patient given time to ask any questions.  Additional education included in AVS.  Alert oriented in good spirits.  PIV removed.by primary RN.   Pressure dressings intact.  Vitals stable.   Pt reports that pain is controlled and has tolerated her lunch.  Spouse at bedside and included in discharge instructions.  Transferred to MAIN A for discharge.

## 2024-06-22 NOTE — Plan of Care (Signed)
" °  Problem: Education: Goal: Knowledge of General Education information will improve Description: Including pain rating scale, medication(s)/side effects and non-pharmacologic comfort measures Outcome: Progressing   Problem: Health Behavior/Discharge Planning: Goal: Ability to manage health-related needs will improve Outcome: Progressing   Problem: Clinical Measurements: Goal: Ability to maintain clinical measurements within normal limits will improve Outcome: Progressing Goal: Will remain free from infection Outcome: Progressing Goal: Diagnostic test results will improve Outcome: Progressing Goal: Respiratory complications will improve Outcome: Progressing Goal: Cardiovascular complication will be avoided Outcome: Progressing   Problem: Activity: Goal: Risk for activity intolerance will decrease Outcome: Progressing   Problem: Nutrition: Goal: Adequate nutrition will be maintained Outcome: Progressing   Problem: Coping: Goal: Level of anxiety will decrease Outcome: Progressing   Problem: Elimination: Goal: Will not experience complications related to bowel motility Outcome: Progressing Goal: Will not experience complications related to urinary retention Outcome: Progressing   Problem: Pain Managment: Goal: General experience of comfort will improve and/or be controlled Outcome: Progressing   Problem: Safety: Goal: Ability to remain free from injury will improve Outcome: Progressing   Problem: Skin Integrity: Goal: Risk for impaired skin integrity will decrease Outcome: Progressing   Problem: Education: Goal: Knowledge of the prescribed therapeutic regimen will improve Outcome: Progressing   Problem: Activity: Goal: Ability to tolerate increased activity will improve Outcome: Progressing   Problem: Health Behavior/Discharge Planning: Goal: Identification of resources available to assist in meeting health care needs will improve Outcome: Progressing    Problem: Nutrition: Goal: Maintenance of adequate nutrition will improve Outcome: Progressing   Problem: Clinical Measurements: Goal: Complications related to the disease process, condition or treatment will be avoided or minimized Outcome: Progressing   Problem: Respiratory: Goal: Will regain and/or maintain adequate ventilation Outcome: Progressing   Problem: Skin Integrity: Goal: Demonstration of wound healing without infection will improve Outcome: Progressing   Problem: Education: Goal: Knowledge of medication regimen will be met for pain relief regimen by discharge Outcome: Progressing   "

## 2024-06-22 NOTE — Discharge Summary (Signed)
 Physician Discharge Summary  Patient ID: Danielle [name] MRN: [id number] DOB/AGE: February 14, 1971 53 y.o.  Admit date: 06/21/2024 Discharge date: 06/22/2024  Admission Diagnoses:  Principal Problem:   Thyroid  nodule Active Problems:   Right thyroid  nodule   Discharge Diagnoses:  Same  Surgeries: Procedures: RIGHT HEMITHYROIDECTOMY on 06/21/2024   Consultants: None  Discharged Condition: Improved  Hospital Course: Danielle [name] is an 53 y.o. female who was admitted 06/21/2024 with a chief complaint of right Thyroid  nodule.  They were brought to the operating room on 06/21/2024 and underwent the above named procedures.  Postoperative course has been uneventful. On POD #1 patient was tolerating PO, with adequate control of pain. Deemed stable for discharge home.   Physical Exam:  General: Awake and alert, no acute distress. Normal voice, no dysphonia or hoarseness Neck: incision C/D/I with dermabond, no evidence of hematoma or seroma Respiratory: Respiratory effort is normal.   Recent vital signs:  Vitals:   06/22/24 0413 06/22/24 1000  BP: (!) 140/70 129/76  Pulse: 73 80  Resp: 20 18  Temp: (!) 97.5 F (36.4 C) 97.6 F (36.4 C)  SpO2: 100% 100%    Recent laboratory studies:  Results for orders placed or performed during the hospital encounter of 06/18/24  Basic metabolic panel per protocol   Collection Time: 06/18/24  9:00 AM  Result Value Ref Range   Sodium 138 135 - 145 mmol/L   Potassium 3.7 3.5 - 5.1 mmol/L   Chloride 102 98 - 111 mmol/L   CO2 24 22 - 32 mmol/L   Glucose, Bld 129 (H) 70 - 99 mg/dL   BUN 10 6 - 20 mg/dL   Creatinine, Ser 9.22 0.44 - 1.00 mg/dL   Calcium 9.5 8.9 - 89.6 mg/dL   GFR, Estimated >39 >39 mL/min   Anion gap 13 5 - 15  CBC per protocol   Collection Time: 06/18/24  9:00 AM  Result Value Ref Range   WBC 8.2 4.0 - 10.5 K/uL   RBC 4.50 3.87 - 5.11 MIL/uL   Hemoglobin 12.6 12.0 - 15.0 g/dL   HCT 60.4 63.9 - 53.9 %   MCV  87.8 80.0 - 100.0 fL   MCH 28.0 26.0 - 34.0 pg   MCHC 31.9 30.0 - 36.0 g/dL   RDW 87.3 88.4 - 84.4 %   Platelets 349 150 - 400 K/uL   nRBC 0.0 0.0 - 0.2 %    Discharge Medications:   Allergies as of 06/22/2024       Reactions   Septra [bactrim] Nausea And Vomiting   Sulfur    Other Reaction(s): Unknown   Flagyl  [metronidazole ] Nausea Only        Medication List     TAKE these medications    albuterol  108 (90 Base) MCG/ACT inhaler Commonly known as: VENTOLIN  HFA Inhale 2 puffs into the lungs every 6 (six) hours as needed for wheezing or shortness of breath.   carvedilol  3.125 MG tablet Commonly known as: Coreg  Take 1 tablet (3.125 mg total) by mouth 2 (two) times daily with a meal.   HYDROcodone -acetaminophen  5-325 MG tablet Commonly known as: NORCO/VICODIN Take 1 tablet by mouth every 6 (six) hours as needed for up to 5 days for moderate pain (pain score 4-6).   ibuprofen  800 MG tablet Commonly known as: ADVIL  Take 1 tablet (800 mg total) by mouth every 8 (eight) hours as needed for mild pain, moderate pain or cramping.   SUMAtriptan  100 MG tablet Commonly known as:  IMITREX  Take 100 mg by mouth every 2 (two) hours as needed for migraine.        Diagnostic Studies: No results found.  Disposition: Discharge disposition: 01-Home or Self Care            Signed: Etsuko Dierolf A Mackynzie Woolford 06/22/2024, 11:04 AM

## 2024-06-24 LAB — SURGICAL PATHOLOGY

## 2024-07-17 ENCOUNTER — Encounter (HOSPITAL_BASED_OUTPATIENT_CLINIC_OR_DEPARTMENT_OTHER): Payer: Self-pay

## 2024-07-17 ENCOUNTER — Ambulatory Visit (HOSPITAL_BASED_OUTPATIENT_CLINIC_OR_DEPARTMENT_OTHER): Admit: 2024-07-17

## 2024-07-17 SURGERY — LOBECTOMY, THYROID
Anesthesia: General | Laterality: Right

## 2024-07-24 ENCOUNTER — Encounter (INDEPENDENT_AMBULATORY_CARE_PROVIDER_SITE_OTHER): Admitting: Otolaryngology
